# Patient Record
Sex: Female | Born: 2000 | Race: White | Hispanic: No | Smoking: Never smoker
Health system: Southern US, Community
[De-identification: ages and names within clinical notes are randomized; demographics above are authoritative.]

## PROBLEM LIST (undated history)

## (undated) ENCOUNTER — Inpatient Hospital Stay: Payer: Self-pay

## (undated) ENCOUNTER — Ambulatory Visit: Admission: EM | Payer: Medicaid Other

## (undated) DIAGNOSIS — F322 Major depressive disorder, single episode, severe without psychotic features: Secondary | ICD-10-CM

## (undated) DIAGNOSIS — F909 Attention-deficit hyperactivity disorder, unspecified type: Secondary | ICD-10-CM

## (undated) DIAGNOSIS — F332 Major depressive disorder, recurrent severe without psychotic features: Secondary | ICD-10-CM

## (undated) DIAGNOSIS — R519 Headache, unspecified: Secondary | ICD-10-CM

## (undated) DIAGNOSIS — F938 Other childhood emotional disorders: Secondary | ICD-10-CM

## (undated) DIAGNOSIS — H5089 Other specified strabismus: Secondary | ICD-10-CM

## (undated) DIAGNOSIS — F319 Bipolar disorder, unspecified: Secondary | ICD-10-CM

## (undated) DIAGNOSIS — R51 Headache: Secondary | ICD-10-CM

## (undated) HISTORY — PX: EYE SURGERY: SHX253

## (undated) HISTORY — DX: Major depressive disorder, single episode, severe without psychotic features: F32.2

## (undated) HISTORY — PX: TYMPANOSTOMY TUBE PLACEMENT: SHX32

## (undated) HISTORY — DX: Major depressive disorder, recurrent severe without psychotic features: F33.2

## (undated) HISTORY — PX: WISDOM TOOTH EXTRACTION: SHX21

---
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
## 2019-02-25 ENCOUNTER — Emergency Department
Admission: EM | Admit: 2019-02-25 | Discharge: 2019-02-26 | Disposition: A | Discharge status: Other Acute Inpatient Hospital | Payer: Medicaid Other | Attending: Student in an Organized Health Care Education/Training Program | Admitting: Student in an Organized Health Care Education/Training Program

## 2019-02-25 ENCOUNTER — Other Ambulatory Visit: Payer: Self-pay

## 2019-02-25 ENCOUNTER — Encounter: Payer: Self-pay | Admitting: *Deleted

## 2019-02-25 DIAGNOSIS — X789XXA Intentional self-harm by unspecified sharp object, initial encounter: Secondary | ICD-10-CM | POA: Diagnosis not present

## 2019-02-25 DIAGNOSIS — Y999 Unspecified external cause status: Secondary | ICD-10-CM | POA: Diagnosis not present

## 2019-02-25 DIAGNOSIS — Y929 Unspecified place or not applicable: Secondary | ICD-10-CM | POA: Diagnosis not present

## 2019-02-25 DIAGNOSIS — R45851 Suicidal ideations: Secondary | ICD-10-CM | POA: Insufficient documentation

## 2019-02-25 DIAGNOSIS — Z79899 Other long term (current) drug therapy: Secondary | ICD-10-CM | POA: Insufficient documentation

## 2019-02-25 DIAGNOSIS — Y939 Activity, unspecified: Secondary | ICD-10-CM | POA: Diagnosis not present

## 2019-02-25 DIAGNOSIS — Z7289 Other problems related to lifestyle: Secondary | ICD-10-CM | POA: Insufficient documentation

## 2019-02-25 DIAGNOSIS — S41111A Laceration without foreign body of right upper arm, initial encounter: Secondary | ICD-10-CM | POA: Insufficient documentation

## 2019-02-25 DIAGNOSIS — F329 Major depressive disorder, single episode, unspecified: Secondary | ICD-10-CM | POA: Diagnosis not present

## 2019-02-25 DIAGNOSIS — F32A Depression, unspecified: Secondary | ICD-10-CM

## 2019-02-25 LAB — COMPREHENSIVE METABOLIC PANEL
ALT: 13 U/L (ref 0–44)
ANION GAP: 9 (ref 5–15)
AST: 16 U/L (ref 15–41)
Albumin: 4.5 g/dL (ref 3.5–5.0)
Alkaline Phosphatase: 37 U/L — ABNORMAL LOW (ref 38–126)
BUN: 9 mg/dL (ref 6–20)
CO2: 24 mmol/L (ref 22–32)
Calcium: 9.2 mg/dL (ref 8.9–10.3)
Chloride: 107 mmol/L (ref 98–111)
Creatinine, Ser: 0.54 mg/dL (ref 0.44–1.00)
GFR calc Af Amer: >60 mL/min (ref >60)
Glucose, Bld: 92 mg/dL (ref 70–99)
Potassium: 3.5 mmol/L (ref 3.5–5.1)
Sodium: 140 mmol/L (ref 135–145)
Total Bilirubin: 0.6 mg/dL (ref 0.3–1.2)
Total Protein: 7.3 g/dL (ref 6.5–8.1)

## 2019-02-25 LAB — ETHANOL

## 2019-02-25 LAB — POCT PREGNANCY, URINE: Preg Test, Ur: NEGATIVE

## 2019-02-25 LAB — URINE DRUG SCREEN, QUALITATIVE (ARMC ONLY)
Amphetamines, Ur Screen: NOT DETECTED
Barbiturates, Ur Screen: NOT DETECTED
Benzodiazepine, Ur Scrn: NOT DETECTED
Cannabinoid 50 Ng, Ur ~~LOC~~: POSITIVE — AB
Cocaine Metabolite,Ur ~~LOC~~: NOT DETECTED
MDMA (ECSTASY) UR SCREEN: NOT DETECTED
METHADONE SCREEN, URINE: NOT DETECTED
Opiate, Ur Screen: NOT DETECTED
Phencyclidine (PCP) Ur S: NOT DETECTED
Tricyclic, Ur Screen: NOT DETECTED

## 2019-02-25 LAB — CBC
HCT: 35.1 % — ABNORMAL LOW (ref 36.0–46.0)
Hemoglobin: 11.6 g/dL — ABNORMAL LOW (ref 12.0–15.0)
MCH: 31.3 pg (ref 26.0–34.0)
MCHC: 33 g/dL (ref 30.0–36.0)
MCV: 94.6 fL (ref 80.0–100.0)
Platelets: 198 10*3/uL (ref 150–400)
RBC: 3.71 MIL/uL — ABNORMAL LOW (ref 3.87–5.11)
RDW: 13.1 % (ref 11.5–15.5)
WBC: 4.8 10*3/uL (ref 4.0–10.5)
nRBC: 0 % (ref 0.0–0.2)

## 2019-02-25 LAB — ACETAMINOPHEN LEVEL: Acetaminophen (Tylenol), Serum: <10 ug/mL — ABNORMAL LOW (ref 10–30)

## 2019-02-25 LAB — SALICYLATE LEVEL: Salicylate Lvl: <7 mg/dL (ref 2.8–30.0)

## 2019-02-25 NOTE — ED Provider Notes (Signed)
[facility] Emergency Department Provider Note    First MD Initiated Contact with Patient 02/25/19 1954     (approximate)  I have reviewed the triage vital signs and the nursing notes.   HISTORY  Chief Complaint Behavior Problem    HPI Christine May is a 18 y.o. female the below listed past medical history presents the ER for evaluation of self cutting behavior and suicidal ideation.  Patient's mother is here in the ER for evaluation of intentional overdose.  Patient found her mother overdosed and unresponsive on her bed.  States that she feels hopeless and wants to die.    Past Medical History:  Diagnosis Date  . ADHD (attention deficit hyperactivity disorder)   . 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 Bipolar 1 disorder (HCC)   . Headache    No family history on file. Past Surgical History:  Procedure Laterality Date  . TYMPANOSTOMY TUBE PLACEMENT     Patient Active Problem List   Diagnosis Date Noted  . Severe single current episode of major depressive disorder, without psychotic features (HCC)   . Severe episode of recurrent major depressive disorder, without psychotic features (HCC)   . Nausea with vomiting   . Cephalalgia   . 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 Major depression 11/06/2015      Prior to Admission medications   Medication Sig Start Date End Date Taking? Authorizing Provider  amphetamine-dextroamphetamine (ADDERALL XR) 20 MG 24 hr capsule Take 20 mg by mouth daily.    [provider]  etonogestrel (IMPLANON) 68 MG IMPL implant 1 each by Subdermal route once.    [provider]  Lurasidone HCl (LATUDA) 60 MG TABS Take 60 mg by mouth daily.    [provider]  mupirocin ointment (BACTROBAN) 2 % Apply three times a day for 7 days. 03/16/17   Renford DillsMiller, Lindsey, NP  sertraline (ZOLOFT) 25 MG tablet Take 1 tablet (25 mg total) by mouth daily. 11/12/15   Gayland Curryadepalli, Gayathri D, MD     Allergies Patient has no known allergies.    Social History Social History   Tobacco Use  . Smoking status: Never Smoker  . Smokeless tobacco: Never Used  Substance Use Topics  . Alcohol use: No  . Drug use: Not on file    Review of Systems Patient denies headaches, rhinorrhea, blurry vision, numbness, shortness of breath, chest pain, edema, cough, abdominal pain, nausea, vomiting, diarrhea, dysuria, fevers, rashes or hallucinations unless otherwise stated above in HPI. ____________________________________________   PHYSICAL EXAM:  VITAL SIGNS: Vitals:   02/25/19 1936  BP: 132/87  Pulse: 89  Resp: 20  Temp: 99.1 F (37.3 C)  SpO2: 100%    Constitutional: Alert and oriented.  Eyes: Conjunctivae are normal.  Head: Atraumatic. Nose: No congestion/rhinnorhea. Mouth/Throat: Mucous membranes are moist.   Neck: No stridor. Painless ROM.  Cardiovascular: Normal rate, regular rhythm. Grossly normal heart sounds.  Good peripheral circulation. Respiratory: Normal respiratory effort.  No retractions. Lungs CTAB. Gastrointestinal: Soft and nontender. No distention. No abdominal bruits. No CVA tenderness. Genitourinary:  Musculoskeletal: No lower extremity tenderness nor edema.  No joint effusions. Neurologic:  Normal speech and language. No gross focal neurologic deficits are appreciated. No facial droop Skin:  Skin is warm, dry.  Multiple superficial abrasions to the right upper arm with 1 3 cm superficial laceration going through the dermis Psychiatric: Mood and affect are depressed and withdrawn speech and behavior are normal.  ____________________________________________  LABS (all labs ordered are listed, but only abnormal results are displayed)  Results for orders placed or performed during the hospital encounter of 02/25/19 (from the past 24 hour(s))  Urine Drug Screen, Qualitative     Status: Abnormal   Collection Time: 02/25/19  7:38 PM  Result Value Ref  Range   Tricyclic, Ur Screen NONE DETECTED NONE DETECTED   Amphetamines, Ur Screen NONE DETECTED NONE DETECTED   MDMA (Ecstasy)Ur Screen NONE DETECTED NONE DETECTED   Cocaine Metabolite,Ur [place] NONE DETECTED NONE DETECTED   Opiate, Ur Screen NONE DETECTED NONE DETECTED   Phencyclidine (PCP) Ur S NONE DETECTED NONE DETECTED   Cannabinoid 50 Ng, Ur [place] POSITIVE (A) NONE DETECTED   Barbiturates, Ur Screen NONE DETECTED NONE DETECTED   Benzodiazepine, Ur Scrn NONE DETECTED NONE DETECTED   Methadone Scn, Ur NONE DETECTED NONE DETECTED  Comprehensive metabolic panel     Status: Abnormal   Collection Time: 02/25/19  7:40 PM  Result Value Ref Range   Sodium 140 135 - 145 mmol/L   Potassium 3.5 3.5 - 5.1 mmol/L   Chloride 107 98 - 111 mmol/L   CO2 24 22 - 32 mmol/L   Glucose, Bld 92 70 - 99 mg/dL   BUN 9 6 - 20 mg/dL   Creatinine, Ser 0.05 0.44 - 1.00 mg/dL   Calcium 9.2 8.9 - 11.0 mg/dL   Total Protein 7.3 6.5 - 8.1 g/dL   Albumin 4.5 3.5 - 5.0 g/dL   AST 16 15 - 41 U/L   ALT 13 0 - 44 U/L   Alkaline Phosphatase 37 (L) 38 - 126 U/L   Total Bilirubin 0.6 0.3 - 1.2 mg/dL   GFR calc non Af Amer >60 >60 mL/min   GFR calc Af Amer >60 >60 mL/min   Anion gap 9 5 - 15  Ethanol     Status: None   Collection Time: 02/25/19  7:40 PM  Result Value Ref Range   Alcohol, Ethyl (B) <10 <10 mg/dL  Salicylate level     Status: None   Collection Time: 02/25/19  7:40 PM  Result Value Ref Range   Salicylate Lvl <7.0 2.8 - 30.0 mg/dL  Acetaminophen level     Status: Abnormal   Collection Time: 02/25/19  7:40 PM  Result Value Ref Range   Acetaminophen (Tylenol), Serum <10 (L) 10 - 30 ug/mL  cbc     Status: Abnormal   Collection Time: 02/25/19  7:40 PM  Result Value Ref Range   WBC 4.8 4.0 - 10.5 K/uL   RBC 3.71 (L) 3.87 - 5.11 MIL/uL   Hemoglobin 11.6 (L) 12.0 - 15.0 g/dL   HCT 21.1 (L) 17.3 - 56.7 %   MCV 94.6 80.0 - 100.0 fL   MCH 31.3 26.0 - 34.0 pg   MCHC 33.0 30.0 - 36.0 g/dL   RDW 01.4 10.3  - 01.3 %   Platelets 198 150 - 400 K/uL   nRBC 0.0 0.0 - 0.2 %  Pregnancy, urine POC     Status: None   Collection Time: 02/25/19  8:25 PM  Result Value Ref Range   Preg Test, Ur NEGATIVE NEGATIVE   ________________________________________________   PROCEDURES  Procedure(s) performed:  Marland KitchenMarland KitchenLaceration Repair Date/Time: 02/25/2019 9:15 PM Performed by: Willy Eddy, MD Authorized by: Willy Eddy, MD   Consent:    Consent obtained:  Verbal   Consent given by:  Patient   Risks discussed:  Infection, pain, retained foreign body, poor cosmetic result  and poor wound healing Anesthesia (see MAR for exact dosages):    Anesthesia method:  None Laceration details:    Location:  Shoulder/arm   Shoulder/arm location:  R upper arm   Length (cm):  3   Depth (mm):  2 Repair type:    Repair type:  Simple Exploration:    Hemostasis achieved with:  Direct pressure   Wound exploration: entire depth of wound probed and visualized     Contaminated: no   Treatment:    Area cleansed with:  Saline and Hibiclens   Amount of cleaning:  Standard   Visualized foreign bodies/material removed: no   Skin repair:    Repair method:  Tissue adhesive Approximation:    Approximation:  Close Post-procedure details:    Dressing:  Sterile dressing   Patient tolerance of procedure:  Tolerated well, no immediate complications      Critical Care performed: no ____________________________________________   INITIAL IMPRESSION / ASSESSMENT AND PLAN / ED COURSE  Pertinent labs & imaging results that were available during my care of the patient were reviewed by me and considered in my medical decision making (see chart for details).   DDX: Psychosis, delirium, medication effect, noncompliance, polysubstance abuse, Si, Hi, depression   Christine May is a 18 y.o. who presents to the ED with for evaluation of si and laceration of rue.  Patient has psych history as listed above.  Laceration  repaired as listed above.  Reports tetanus is up to date.  Laboratory testing was ordered to evaluation for underlying electrolyte derangement or signs of underlying organic pathology to explain today's presentation.  Based on history and physical and laboratory evaluation, it appears that the patient's presentation is 2/2 underlying psychiatric disorder and will require further evaluation and management by inpatient psychiatry.  Patient was made an IVC due to .  Disposition pending psychiatric evaluation.       As part of my medical decision making, I reviewed the following data within the electronic MEDICAL RECORD NUMBER Nursing notes reviewed and incorporated, Labs reviewed, notes from prior ED visits and [place] Controlled Substance Database   ____________________________________________   FINAL CLINICAL IMPRESSION(S) / ED DIAGNOSES  Final diagnoses:  Depression, unspecified depression type  Deliberate self-cutting      NEW MEDICATIONS STARTED DURING THIS VISIT:  New Prescriptions   No medications on file     Note:  This document was prepared using Dragon voice recognition software and may include unintentional dictation errors.    Willy Eddy, MD 02/25/19 2137

## 2019-02-25 NOTE — BH Assessment (Addendum)
Assessment Note  Christine May is an 18 y.o. female. Christine May arrived to the ED by way of personal transportation by her father.  She reports, "My mom overdosed because of credit card fraud that she committed, because she has not had a job for [name], so that leaves her at home by herself. I came home from my friends house and I got a text from my grandma from my mom, saying to take care of me and my dad, and we rushed home. We found her. My dad told me not to call 911 but I did anyway.  Then I started feeling depressed and I ended up cutting myself and then I went for a walk and then we came to the hospital to see my mom, and then I decided to admit myself.". She states that she is depressed when she is by herself.  She is currently working on her [employer].   She reports symptoms of anxiety. She denied having auditory or visual hallucinations.  She denied homicidal ideation or intent.  She denied suicidal ideation or intent. She had multiple cuts to her upper arm.  She reports stress from moving a lot. Everything went down hill since my mom committed credit card fraud. My father is stressed, my mother is an alcoholic, I smoke weed to calm me down. I had to give away both of my animals, and she kept her dog. I feel that she is being very selfish to [place]. I don't think she will ever be able to get a decent job.  She further reports that she is going through a break up.   TTS contacted father Christine May at [phone], with consent from [name].  Reports that she was upset with what is going on with my wife.  My daughter said she was upset and wanted to go for a walk and I did not know what was going on until we got to the hospital.  She stated "You know what I did".  "So I kinda figured she was cutting. She has a history of cutting, and was hospitalized in the past. "We are dealing with a lot of financial struggles, and my wife's issues, and Christine May and her boyfriend broke up. I know Christine May and my wife had a  problem last night and Christine May slept at a friend's house". He states that he picked her up and that is when she done her thing. "She was upset when we called 911.  I think the last 2 day Christine May and my wife have been fussing and I think it is too much that sent her over the edge.  I think all of this may have overwhelmed her".  Diagnosis: Depression  Past Medical History:  Past Medical History:  Diagnosis Date  . ADHD (attention deficit hyperactivity disorder)   . 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 Bipolar 1 disorder (HCC)   . Headache     Past Surgical History:  Procedure Laterality Date  . TYMPANOSTOMY TUBE PLACEMENT      Family History: No family history on file.  Social History:  reports that she has never smoked. She has never used smokeless tobacco. She reports that she does not drink alcohol. No history on file for drug.  Additional Social History:  Alcohol / Drug Use History of alcohol / drug use?: Yes Substance #1 Name of Substance 1: Marijuana 1 - Age of First Use: 15 1 - Amount (size/oz): 3 blunts a day 1 - Frequency: daily 1 -  Last Use / Amount: 02/25/2019  CIWA: CIWA-Ar BP: 132/87 Pulse Rate: 89 COWS:    Allergies: No Known Allergies  Home Medications: (Not in a hospital admission)   OB/GYN Status:  No LMP recorded. Patient has had an implant.  General Assessment Data Location of Assessment: [facility] ED TTS Assessment: In system Is this a Tele or Face-to-Face Assessment?: Face-to-Face Is this an Initial Assessment or a Re-assessment for this encounter?: Initial Assessment Patient Accompanied by:: N/A Language Other than English: No Living Arrangements: Other (Comment)(Private residence) What gender do you identify as?: Female Marital status: Single Pregnancy Status: No Living Arrangements: Parent Can pt return to current living arrangement?: Yes Admission Status: Voluntary Is patient capable of signing voluntary admission?: Yes Referral Source:  Self/Family/Friend Insurance type: None  Medical Screening Exam [facility] Walk-in ONLY) Medical Exam completed: Yes  Crisis Care Plan Living Arrangements: Parent Legal Guardian: Other:(Self) Name of Psychiatrist: None Name of Therapist: None  Education Status Is patient currently in school?: Yes(Online) Current Grade: GED program Highest grade of school patient has completed: 9th Name of school: [employer]  Risk to self with the past 6 months Suicidal Ideation: No Has patient been a risk to self within the past 6 months prior to admission? : No Suicidal Intent: No Has patient had any suicidal intent within the past 6 months prior to admission? : No Is patient at risk for suicide?: No Suicidal Plan?: No Has patient had any suicidal plan within the past 6 months prior to admission? : No Access to Means: No What has been your use of drugs/alcohol within the last 12 months?: daily use of marijuana Previous Attempts/Gestures: Yes How many times?: 4 Other Self Harm Risks: cutting Triggers for Past Attempts: Unknown Intentional Self Injurious Behavior: Cutting Family Suicide History: (Mother overdosed today, ) Recent stressful life event(s): Other (Comment)(Family problems, break up with boyfriend) Persecutory voices/beliefs?: No Depression: Yes Depression Symptoms: Feeling worthless/self pity Substance abuse history and/or treatment for substance abuse?: Yes Suicide prevention information given to non-admitted patients: Not applicable  Risk to Others within the past 6 months Homicidal Ideation: No Does patient have any lifetime risk of violence toward others beyond the six months prior to admission? : No Thoughts of Harm to Others: No Current Homicidal Intent: No Current Homicidal Plan: No Access to Homicidal Means: No Identified Victim: None identified History of harm to others?: No Assessment of Violence: None Noted Violent Behavior Description: denied Does  patient have access to weapons?: No Criminal Charges Pending?: No Does patient have a court date: No Is patient on probation?: No  Psychosis Hallucinations: None noted Delusions: None noted  Mental Status Report Appearance/Hygiene: In scrubs Eye Contact: Fair Motor Activity: Unremarkable Speech: Logical/coherent, Soft Level of Consciousness: Alert Mood: Helpless Affect: Appropriate to circumstance Anxiety Level: None Thought Processes: Coherent Judgement: Partial Orientation: Appropriate for developmental age Obsessive Compulsive Thoughts/Behaviors: None  Cognitive Functioning Concentration: Fair Memory: Recent Intact Is patient IDD: No Insight: Fair Impulse Control: Fair Appetite: Good Have you had any weight changes? : No Change Sleep: Decreased Vegetative Symptoms: None  ADLScreening [facility] Assessment Services) Patient's cognitive ability adequate to safely complete daily activities?: Yes Patient able to express need for assistance with ADLs?: Yes Independently performs ADLs?: Yes (appropriate for developmental age)  Prior Inpatient Therapy Prior Inpatient Therapy: Yes Prior Therapy Dates: 2016 Prior Therapy Facilty/Provider(s): [facility] Reason for Treatment: Depression  Prior Outpatient Therapy Prior Outpatient Therapy: Yes Prior Therapy Dates: Until 2018 Prior Therapy Facilty/Provider(s): [facility]  [place] Reason for Treatment: Depression Does patient have an ACCT team?: No Does patient have Intensive In-House Services?  : No Does patient have [facility] services? : No Does patient have P4CC services?: No  ADL Screening (condition at time of admission) Patient's cognitive ability adequate to safely complete daily activities?: Yes Is the patient deaf or have difficulty hearing?: No Does the patient have difficulty seeing, even when wearing glasses/contacts?: No Does the patient have difficulty concentrating, remembering, or making  decisions?: No Patient able to express need for assistance with ADLs?: Yes Does the patient have difficulty dressing or bathing?: No Independently performs ADLs?: Yes (appropriate for developmental age) Does the patient have difficulty walking or climbing stairs?: No Weakness of Legs: None Weakness of Arms/Hands: None  Home Assistive Devices/Equipment Home Assistive Devices/Equipment: None    Abuse/Neglect Assessment (Assessment to be complete while patient is alone) Abuse/Neglect Assessment Can Be Completed: (denied a history of abuse)                Disposition:  Disposition Initial Assessment Completed for this Encounter: Yes  On Site Evaluation by:   Reviewed with Physician:    Justice Deeds 02/25/2019 11:55 PM

## 2019-02-25 NOTE — ED Notes (Signed)
1 cell phone and vape given to father.

## 2019-02-25 NOTE — ED Notes (Signed)
Hourly rounding reveals patient in room. EDP at bedside repairing lac. No complaints, stable, in no acute distress. Q15 minute rounds and monitoring via Psychologist, counselling to continue.

## 2019-02-25 NOTE — ED Notes (Signed)
Pt has 2 hair ties, 1 gray colored necklace, multiple stud earrings, 1 navel stud, 1 pair of jeans, 1 panty,  1 black bra, 1 burgundy/ white shirt, 1 pair green socks, 1 pair black vann's.

## 2019-02-25 NOTE — ED Triage Notes (Addendum)
Pt brought in by father.  Pt states she cut herself after her mother tried to kill herself today.  Pt has superficial cuts to right upper arm.  Pt cut self with a knife.  Bleeding controlled.  Pt crying in triage.   Pt uses drugs.  Denies etoh use.

## 2019-02-25 NOTE — ED Notes (Signed)
Pt. Transferred from Triage to room 23 after dressing out and screening for contraband. Pt. Oriented to [place] including Q15 minute rounds as well as Psychologist, counselling for their protection. Patient is alert and oriented, warm and dry in no acute distress. Patient denies HI, and AH. Patient states she still has SI without plan and sees visions of her Mom. Pt. Encouraged to let me know if needs arise.

## 2019-02-25 NOTE — ED Notes (Signed)
Hourly rounding reveals patient in room. No complaints, stable, in no acute distress. Q15 minute rounds and monitoring via Rover and Officer to continue.   

## 2019-02-26 ENCOUNTER — Other Ambulatory Visit: Payer: Self-pay

## 2019-02-26 ENCOUNTER — Encounter (HOSPITAL_COMMUNITY): Payer: Self-pay | Admitting: *Deleted

## 2019-02-26 ENCOUNTER — Inpatient Hospital Stay (HOSPITAL_COMMUNITY)
Admission: AD | Admit: 2019-02-26 | Discharge: 2019-02-28 | DRG: 881 | Disposition: A | Discharge status: Home / Self Care | Payer: Medicaid Other | Source: Intra-hospital | Attending: Psychiatry | Admitting: Psychiatry

## 2019-02-26 DIAGNOSIS — F322 Major depressive disorder, single episode, severe without psychotic features: Secondary | ICD-10-CM

## 2019-02-26 DIAGNOSIS — Z79899 Other long term (current) drug therapy: Secondary | ICD-10-CM

## 2019-02-26 DIAGNOSIS — G479 Sleep disorder, unspecified: Secondary | ICD-10-CM | POA: Diagnosis present

## 2019-02-26 DIAGNOSIS — F329 Major depressive disorder, single episode, unspecified: Secondary | ICD-10-CM | POA: Diagnosis present

## 2019-02-26 DIAGNOSIS — F909 Attention-deficit hyperactivity disorder, unspecified type: Secondary | ICD-10-CM | POA: Diagnosis present

## 2019-02-26 DIAGNOSIS — F419 Anxiety disorder, unspecified: Secondary | ICD-10-CM | POA: Diagnosis present

## 2019-02-26 DIAGNOSIS — F1210 Cannabis abuse, uncomplicated: Secondary | ICD-10-CM | POA: Diagnosis not present

## 2019-02-26 DIAGNOSIS — Z818 Family history of other mental and behavioral disorders: Secondary | ICD-10-CM | POA: Diagnosis not present

## 2019-02-26 DIAGNOSIS — F1290 Cannabis use, unspecified, uncomplicated: Secondary | ICD-10-CM | POA: Diagnosis present

## 2019-02-26 DIAGNOSIS — Z7289 Other problems related to lifestyle: Secondary | ICD-10-CM | POA: Insufficient documentation

## 2019-02-26 DIAGNOSIS — Z811 Family history of alcohol abuse and dependence: Secondary | ICD-10-CM | POA: Diagnosis not present

## 2019-02-26 DIAGNOSIS — Z915 Personal history of self-harm: Secondary | ICD-10-CM | POA: Diagnosis not present

## 2019-02-26 DIAGNOSIS — R45851 Suicidal ideations: Secondary | ICD-10-CM | POA: Diagnosis present

## 2019-02-26 MED ORDER — ESCITALOPRAM OXALATE 5 MG PO TABS
5.0000 mg | ORAL_TABLET | Freq: Every day | ORAL | Status: DC
  Administered 2019-02-26 – 2019-02-28 (×3): 5 mg via ORAL
  Filled 2019-02-26: qty 1
  Filled 2019-02-26: qty 7
  Filled 2019-02-26 (×4): qty 1

## 2019-02-26 MED ORDER — MAGNESIUM HYDROXIDE 400 MG/5ML PO SUSP: 30.0000 mL | Freq: Every day | ORAL | Status: DC | PRN

## 2019-02-26 MED ORDER — HYDROXYZINE HCL 25 MG PO TABS: 25.0000 mg | ORAL_TABLET | Freq: Four times a day (QID) | ORAL | Status: DC | PRN

## 2019-02-26 MED ORDER — ALUM & MAG HYDROXIDE-SIMETH 200-200-20 MG/5ML PO SUSP: 30.0000 mL | ORAL | Status: DC | PRN

## 2019-02-26 MED ORDER — ACETAMINOPHEN 325 MG PO TABS: 650.0000 mg | ORAL_TABLET | Freq: Four times a day (QID) | ORAL | Status: DC | PRN

## 2019-02-26 MED ORDER — ESCITALOPRAM OXALATE 5 MG PO TABS: 5.0000 mg | ORAL_TABLET | Freq: Every day | ORAL | Status: DC

## 2019-02-26 MED ORDER — TRAZODONE HCL 50 MG PO TABS
50.0000 mg | ORAL_TABLET | Freq: Every evening | ORAL | Status: DC | PRN
  Administered 2019-02-26 – 2019-02-27 (×2): 50 mg via ORAL
  Filled 2019-02-26 (×5): qty 1
  Filled 2019-02-26: qty 14
  Filled 2019-02-26: qty 1
  Filled 2019-02-26: qty 14
  Filled 2019-02-26: qty 1

## 2019-02-26 NOTE — ED Notes (Signed)
Hourly rounding reveals patient sleeping in room. No complaints, stable, in no acute distress. Q15 minute rounds and monitoring via Rover and Officer to continue.  

## 2019-02-26 NOTE — ED Notes (Signed)
[place]  SHERIFF  DEPT  CALLED 

## 2019-02-26 NOTE — ED Notes (Signed)
Pt transferred under IVC to [facility].  Patient's father notified by this Clinical research associate of transfer. VS stable.  Report given to Ethelene Browns, [place].  Belongings sent with officers. Pt compliant with transfer.

## 2019-02-26 NOTE — Progress Notes (Signed)
Christine May is an 18 year old female pt admitted on involuntary basis from [facility] hospital. On admission, she spoke about her mother overdosing and how she took a razor and cut her arm in response to that. She denies SI currently and is able to contract for safety while in the hospital. She reports that she had been feeling ok, she reports that she works and goes to school part-time. She does endorse marijuana usage but denies any other substance use. She reports that she is not on any prescribed medications. She reports that she lives at home with her parents and reports that she will return back there once she is discharged. Christine May was cooperative during admission, she was oriented to the milieu and safety maintained.

## 2019-02-26 NOTE — ED Notes (Signed)
Hourly rounding reveals patient in room. No complaints, stable, in no acute distress. Q15 minute rounds and monitoring via Rover and Officer to continue.   

## 2019-02-26 NOTE — ED Notes (Signed)
EMTALA reviewed by charge RN 

## 2019-02-26 NOTE — Tx Team (Signed)
Initial Treatment Plan 02/26/2019 12:36 PM Christine May [id number]    PATIENT STRESSORS: Marital or family conflict   PATIENT STRENGTHS: Ability for insight Average or above average intelligence Capable of independent living General fund of knowledge   PATIENT IDENTIFIED PROBLEMS: Depression Suicidal thoughts "I want to feel happy"                     DISCHARGE CRITERIA:  Ability to meet basic life and health needs Improved stabilization in mood, thinking, and/or behavior Reduction of life-threatening or endangering symptoms to within safe limits Verbal commitment to aftercare and medication compliance  PRELIMINARY DISCHARGE PLAN: Attend aftercare/continuing care group Return to previous living arrangement  PATIENT/FAMILY INVOLVEMENT: This treatment plan has been presented to and reviewed with the patient, Christine May, and/or family member, .  The patient and family have been given the opportunity to ask questions and make suggestions.  [name], [place], [place] 02/26/2019, 12:36 PM

## 2019-02-26 NOTE — H&P (Signed)
Psychiatric Admission Assessment Adult  Patient Identification: Christine May MRN:  [id number] Date of Evaluation:  02/26/2019 Chief Complaint:  MDD Principal Diagnosis: MDD (major depressive disorder), severe (HCC) Diagnosis:  Principal Problem:   MDD (major depressive disorder), severe (HCC)  History of Present Illness: From MD's admission SRA: Patient is an 18 year old female with a reported past psychiatric history significant for bipolar disorder who presented to the [facility] emergency department on 02/25/2019 with suicidal ideation.  The patient stated that she had been under increased stress recently.  Her mother had apparently picked up a credit card in a parking lot in December, and spent excessively on it.  Unfortunately this is led to some legal issues with regard to credit card fraud.  She also admitted that she had broken up with a boyfriend in early January.  The stressors led her to restart cutting again.  She stated she had not cut an extended period of time, but after this most recent issue with her mother she started again.  She stated she had cut her upper right arm with a knife from her bonsai tree.  She denied any other self-harm.  On the day of admission she received a text from her mother about taking care of her father and grandmother.  When she got home they found her mother had overdosed on unspecified medication.  Additionally earlier this week her mother had reportedly been drinking excessively and the patient was upset over this.  When emergency medical services took the patient to the hospital the patient also requested a psychiatric evaluation because she was having suicidal ideation.  As stated above, the patient had a past psychiatric history where she was admitted to the hospital after an intentional overdose of Advil as an adolescent.  This was in 2015.  Again, she was prescribed Latuda and Zoloft.  At that time she felt as though both  medications made her feel "like a zombie".  She stopped them shortly after discharge.  She had continued therapy until approximately 2 years ago and was told that she had completed therapy.  Prior to the issues with her mother she was doing relatively well.  She had left high school because of bullying, and was currently working at [employer] and working on her GED.  She had had no suicide attempts since 2015.  She actually sounded as though she had coped quite well through some of these things.  She denied current suicidal ideation but did admit to being depressed, feeling helpless, somewhat hopeless and tearful about all the episodes involving her mother.  She is not thrilled the death with taking medication, but is willing to try something if it helps her mood.  She did admit that over the last 3 weeks she had had increased sadness over much of this.  She was admitted to the hospital for evaluation and stabilization.  Associated Signs/Symptoms: Depression Symptoms:  depressed mood, anxiety, disturbed sleep, cutting self (Hypo) Manic Symptoms:  denies Anxiety Symptoms:  Excessive Worry, Psychotic Symptoms:  denies PTSD Symptoms: Negative Total Time spent with patient: 45 minutes  Past Psychiatric History: Prior episodes of depression. Hospitalized at [facility] in 2016 after suicide attempt via overdose on 10 migraine medication tabs and 3 Advil. She was prescribed Latuda and Zoloft but stopped taking years ago because she "felt like a zombie." Denies history of manic symptoms. Denies history of psychosis or violence. Treated for ADHD, depression, and "anger outbursts" during childhood. History of self-injurious  behavior (cutting) during childhood but reports stopping 2 years ago. Previously saw therapist at [facility] until 2018.  Is the patient at risk to self? Yes.    Has the patient been a risk to self in the past 6 months? No.  Has the patient been a risk to self within the  distant past? Yes.    Is the patient a risk to others? No.  Has the patient been a risk to others in the past 6 months? No.  Has the patient been a risk to others within the distant past? No.   Prior Inpatient Therapy:   Prior Outpatient Therapy:    Alcohol Screening: 1. How often do you have a drink containing alcohol?: Never 2. How many drinks containing alcohol do you have on a typical day when you are drinking?: 1 or 2 3. How often do you have six or more drinks on one occasion?: Never AUDIT-C Score: 0 4. How often during the last year have you found that you were not able to stop drinking once you had started?: Never 5. How often during the last year have you failed to do what was normally expected from you becasue of drinking?: Never 6. How often during the last year have you needed a first drink in the morning to get yourself going after a heavy drinking session?: Never 7. How often during the last year have you had a feeling of guilt of remorse after drinking?: Never 8. How often during the last year have you been unable to remember what happened the night before because you had been drinking?: Never 9. Have you or someone else been injured as a result of your drinking?: No 10. Has a relative or friend or a doctor or another health worker been concerned about your drinking or suggested you cut down?: No Alcohol Use Disorder Identification Test Final Score (AUDIT): 0 Alcohol Brief Interventions/Follow-up: AUDIT Score <7 follow-up not indicated Substance Abuse History in the last 12 months:  Yes.  Daily THC use. Consequences of Substance Abuse: denies Previous Psychotropic Medications: Yes  Psychological Evaluations: No  Past Medical History:  Past Medical History:  Diagnosis Date  . ADHD (attention deficit hyperactivity disorder)   . 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 Bipolar 1 disorder (HCC)   . Headache     Past Surgical History:  Procedure Laterality Date  .  TYMPANOSTOMY TUBE PLACEMENT     Family History: History reviewed. No pertinent family history. Family Psychiatric  History: Mother with depression and alcohol use disorder.  Tobacco Screening: Have you used any form of tobacco in the last 30 days? (Cigarettes, Smokeless Tobacco, Cigars, and/or Pipes): No Social History:  Social History   Substance and Sexual Activity  Alcohol Use No     Social History   Substance and Sexual Activity  Drug Use Yes  . Types: Marijuana    Additional Social History:                           Allergies:  No Known Allergies Lab Results:  Results for orders placed or performed during the hospital encounter of 02/25/19 (from the past 48 hour(s))  Urine Drug Screen, Qualitative     Status: Abnormal   Collection Time: 02/25/19  7:38 PM  Result Value Ref Range   Tricyclic, Ur Screen NONE DETECTED NONE DETECTED   Amphetamines, Ur Screen NONE DETECTED NONE DETECTED   MDMA (Ecstasy)Ur Screen NONE DETECTED  NONE DETECTED   Cocaine Metabolite,Ur [place] NONE DETECTED NONE DETECTED   Opiate, Ur Screen NONE DETECTED NONE DETECTED   Phencyclidine (PCP) Ur S NONE DETECTED NONE DETECTED   Cannabinoid 50 Ng, Ur [place] POSITIVE (A) NONE DETECTED   Barbiturates, Ur Screen NONE DETECTED NONE DETECTED   Benzodiazepine, Ur Scrn NONE DETECTED NONE DETECTED   Methadone Scn, Ur NONE DETECTED NONE DETECTED    Comment: (NOTE) Tricyclics + metabolites, urine    Cutoff 1000 ng/mL Amphetamines + metabolites, urine  Cutoff 1000 ng/mL MDMA (Ecstasy), urine              Cutoff 500 ng/mL Cocaine Metabolite, urine          Cutoff 300 ng/mL Opiate + metabolites, urine        Cutoff 300 ng/mL Phencyclidine (PCP), urine         Cutoff 25 ng/mL Cannabinoid, urine                 Cutoff 50 ng/mL Barbiturates + metabolites, urine  Cutoff 200 ng/mL Benzodiazepine, urine              Cutoff 200 ng/mL Methadone, urine                   Cutoff 300 ng/mL The urine drug screen provides  only a preliminary, unconfirmed analytical test result and should not be used for non-medical purposes. Clinical consideration and professional judgment should be applied to any positive drug screen result due to possible interfering substances. A more specific alternate chemical method must be used in order to obtain a confirmed analytical result. Gas chromatography / mass spectrometry (GC/MS) is the preferred confirmat ory method. Performed at [facility], [address] [place], [place] [address]   Comprehensive metabolic panel     Status: Abnormal   Collection Time: 02/25/19  7:40 PM  Result Value Ref Range   Sodium 140 135 - 145 mmol/L   Potassium 3.5 3.5 - 5.1 mmol/L   Chloride 107 98 - 111 mmol/L   CO2 24 22 - 32 mmol/L   Glucose, Bld 92 70 - 99 mg/dL   BUN 9 6 - 20 mg/dL   Creatinine, Ser 6.04 0.44 - 1.00 mg/dL   Calcium 9.2 8.9 - 54.0 mg/dL   Total Protein 7.3 6.5 - 8.1 g/dL   Albumin 4.5 3.5 - 5.0 g/dL   AST 16 15 - 41 U/L   ALT 13 0 - 44 U/L   Alkaline Phosphatase 37 (L) 38 - 126 U/L   Total Bilirubin 0.6 0.3 - 1.2 mg/dL   GFR calc non Af Amer >60 >60 mL/min   GFR calc Af Amer >60 >60 mL/min   Anion gap 9 5 - 15    Comment: Performed at [facility], [address] [place], [place] [address]  Ethanol     Status: None   Collection Time: 02/25/19  7:40 PM  Result Value Ref Range   Alcohol, Ethyl (B) <10 <10 mg/dL    Comment: (NOTE) Lowest detectable limit for serum alcohol is 10 mg/dL. For medical purposes only. Performed at [facility], [address] [place], [place] [address]   Salicylate level     Status: None   Collection Time: 02/25/19  7:40 PM  Result Value Ref Range   Salicylate Lvl <7.0 2.8 - 30.0 mg/dL    Comment: Performed at [facility], [address] [place], [place] [address]  Acetaminophen level  Status: Abnormal   Collection Time: 02/25/19  7:40 PM  Result Value Ref Range   Acetaminophen  (Tylenol), Serum <10 (L) 10 - 30 ug/mL    Comment: (NOTE) Therapeutic concentrations vary significantly. A range of 10-30 ug/mL  may be an effective concentration for many patients. However, some  are best treated at concentrations outside of this range. Acetaminophen concentrations >150 ug/mL at 4 hours after ingestion  and >50 ug/mL at 12 hours after ingestion are often associated with  toxic reactions. Performed at [facility], [address] [place], [place] [address]   cbc     Status: Abnormal   Collection Time: 02/25/19  7:40 PM  Result Value Ref Range   WBC 4.8 4.0 - 10.5 K/uL   RBC 3.71 (L) 3.87 - 5.11 MIL/uL   Hemoglobin 11.6 (L) 12.0 - 15.0 g/dL   HCT 60.435.1 (L) 54.036.0 - 98.146.0 %   MCV 94.6 80.0 - 100.0 fL   MCH 31.3 26.0 - 34.0 pg   MCHC 33.0 30.0 - 36.0 g/dL   RDW 19.113.1 47.811.5 - 29.515.5 %   Platelets 198 150 - 400 K/uL   nRBC 0.0 0.0 - 0.2 %    Comment: Performed at [facility], [address] [place], [place] [address]  Pregnancy, urine POC     Status: None   Collection Time: 02/25/19  8:25 PM  Result Value Ref Range   Preg Test, Ur NEGATIVE NEGATIVE    Comment:        THE SENSITIVITY OF THIS METHODOLOGY IS >24 mIU/mL     Blood Alcohol level:  Lab Results  Component Value Date   ETH <10 02/25/2019   ETH <5 11/05/2015    Metabolic Disorder Labs:  Lab Results  Component Value Date   HGBA1C 5.3 10/06/2014   No results found for: PROLACTIN Lab Results  Component Value Date   CHOL 182 10/06/2014   TRIG 55 10/06/2014   HDL 109 (H) 10/06/2014   VLDL 11 10/06/2014   LDLCALC 62 10/06/2014    Current Medications: Current Facility-Administered Medications  Medication Dose Route Frequency Provider Last Rate Last Dose  . acetaminophen (TYLENOL) tablet 650 mg  650 mg Oral Q6H PRN Antonieta Pertlary, Greg Lawson, MD      . alum & mag hydroxide-simeth (MAALOX/MYLANTA) 200-200-20 MG/5ML suspension 30 mL  30 mL Oral Q4H PRN Antonieta Pertlary, Greg Lawson, MD      .  escitalopram (LEXAPRO) tablet 5 mg  5 mg Oral Daily Aldean BakerSykes, Leela Vanbrocklin E, NP      . hydrOXYzine (ATARAX/VISTARIL) tablet 25 mg  25 mg Oral Q6H PRN Antonieta Pertlary, Greg Lawson, MD      . magnesium hydroxide (MILK OF MAGNESIA) suspension 30 mL  30 mL Oral Daily PRN Antonieta Pertlary, Greg Lawson, MD      . traZODone (DESYREL) tablet 50 mg  50 mg Oral QHS,MR X 1 Clary, Marlane MingleGreg Lawson, MD       PTA Medications: Medications Prior to Admission  Medication Sig Dispense Refill Last Dose  . amphetamine-dextroamphetamine (ADDERALL XR) 20 MG 24 hr capsule Take 20 mg by mouth daily.   11/06/2015 at Unknown time  . etonogestrel (IMPLANON) 68 MG IMPL implant 1 each by Subdermal route once.     . Lurasidone HCl (LATUDA) 60 MG TABS Take 60 mg by mouth daily.   11/06/2015 at Unknown time  . mupirocin ointment (BACTROBAN) 2 % Apply three times a day for 7 days. 22 g 0   . sertraline (ZOLOFT)  25 MG tablet Take 1 tablet (25 mg total) by mouth daily. 30 tablet 0     Musculoskeletal: Strength & Muscle Tone: within normal limits Gait & Station: normal Patient leans: N/A  Psychiatric Specialty Exam: Physical Exam  Nursing note and vitals reviewed. Constitutional: She is oriented to person, place, and time. She appears well-developed and well-nourished.  Cardiovascular: Normal rate.  Respiratory: Effort normal.  Neurological: She is alert and oriented to person, place, and time.    Review of Systems  Constitutional: Negative.   Psychiatric/Behavioral: Positive for depression and substance abuse (THC). Negative for hallucinations, memory loss and suicidal ideas. The patient is not nervous/anxious and does not have insomnia.     Blood pressure 110/71, pulse 73, temperature 98.6 F (37 C), temperature source Oral, resp. rate 18, height 5\' 7"  (1.702 m), weight 64.4 kg.Body mass index is 22.24 kg/m.  See MD's admission SRA    Treatment Plan Summary: Daily contact with patient to assess and evaluate symptoms and progress in treatment and  Medication management   Inpatient hospitalization.  Start Lexapro 5 mg PO daily for mood Continue Vistaril 25 mg PO Q6HR PRN anxiety Continue trazodone 50 mg PO QHS PRN insomnia  Patient will participate in the therapeutic group milieu.  Discharge disposition in progress.   Observation Level/Precautions:  15 minute checks  Laboratory:  Reviewed  Psychotherapy:  Group therapy  Medications:  See MAR  Consultations:  PRN  Discharge Concerns:  Safety and stabilization  Estimated LOS: 2-4 days  Other:     Physician Treatment Plan for Primary Diagnosis: MDD (major depressive disorder), severe (HCC) Long Term Goal(s): Improvement in symptoms so as ready for discharge  Short Term Goals: Ability to identify changes in lifestyle to reduce recurrence of condition will improve and Ability to verbalize feelings will improve  Physician Treatment Plan for Secondary Diagnosis: Principal Problem:   MDD (major depressive disorder), severe (HCC)  Long Term Goal(s): Improvement in symptoms so as ready for discharge  Short Term Goals: Ability to demonstrate self-control will improve and Ability to identify and develop effective coping behaviors will improve  I certify that inpatient services furnished can reasonably be expected to improve the patient's condition.    Aldean Baker, NP 3/11/20202:12 PM

## 2019-02-26 NOTE — ED Notes (Signed)
Patient has been accepted to [facility]  Patient assigned to room 400 Bed 1 Accepting physician is Donell Sievert  Call report to [phone].  Representative was [employer].   ER Staff is aware of it:  Carlene ER Secretary  Dr. Darnelle Catalan, ER MD  Jillyn Hidden Patient's Nurse     Patient's Family/Support System [facility] [phone] ) have been updated as well.

## 2019-02-26 NOTE — BHH Suicide Risk Assessment (Signed)
[facility] Admission Suicide Risk Assessment   Nursing information obtained from:  Patient Demographic factors:  Adolescent or young adult, Caucasian, Low socioeconomic status Current Mental Status:  NA Loss Factors:  NA Historical Factors:  Prior suicide attempts, Family history of mental illness or substance abuse Risk Reduction Factors:  Living with another person, especially a relative, Positive therapeutic relationship, Positive coping skills or problem solving skills  Total Time spent with patient: 45 minutes Principal Problem: <principal problem not specified> Diagnosis:  Active Problems:   MDD (major depressive disorder), severe (HCC)  Subjective Data: Patient is seen and examined.  Patient is an 18 year old female with a reported past psychiatric history significant for bipolar disorder who presented to the [facility] emergency department on 02/25/2019 with suicidal ideation.  The patient stated that she had been under increased stress recently.  Her mother had apparently picked up a credit card in a parking lot in December, and spent excessively on it.  Unfortunately this is led to some legal issues with regard to credit card fraud.  She also admitted that she had broken up with a boyfriend in early January.  The stressors led her to restart cutting again.  She stated she had not cut an extended period of time, but after this most recent issue with her mother she started again.  She stated she had cut her upper right arm with a knife from her bonsai tree.  She denied any other self-harm.  On the day of admission she received a text from her mother about taking care of her father and grandmother.  When she got home they found her mother had overdosed on unspecified medication.  Additionally earlier this week her mother had reportedly been drinking excessively and the patient was upset over this.  When emergency medical services took the patient to the hospital the patient  also requested a psychiatric evaluation because she was having suicidal ideation.  As stated above, the patient had a past psychiatric history where she was admitted to the hospital after an intentional overdose of Advil as an adolescent.  This was in 2015.  Again, she was prescribed Latuda and Zoloft.  At that time she felt as though both medications made her feel "like a zombie".  She stopped them shortly after discharge.  She had continued therapy until approximately 2 years ago and was told that she had completed therapy.  Prior to the issues with her mother she was doing relatively well.  She had left high school because of bullying, and was currently working at [employer] and working on her GED.  She had had no suicide attempts since 2015.  She actually sounded as though she had coped quite well through some of these things.  She denied current suicidal ideation but did admit to being depressed, feeling helpless, somewhat hopeless and tearful about all the episodes involving her mother.  She is not thrilled the death with taking medication, but is willing to try something if it helps her mood.  She did admit that over the last 3 weeks she had had increased sadness over much of this.  She was admitted to the hospital for evaluation and stabilization.  Continued Clinical Symptoms:  Alcohol Use Disorder Identification Test Final Score (AUDIT): 0 The "Alcohol Use Disorders Identification Test", Guidelines for Use in Primary Care, Second Edition.  World Science writer [facility]). Score between 0-7:  no or low risk or alcohol related problems. Score between 8-15:  moderate risk  of alcohol related problems. Score between 16-19:  high risk of alcohol related problems. Score 20 or above:  warrants further diagnostic evaluation for alcohol dependence and treatment.   CLINICAL FACTORS:   Depression:   Anhedonia Hopelessness Impulsivity Insomnia Alcohol/Substance  Abuse/Dependencies   Musculoskeletal: Strength & Muscle Tone: within normal limits Gait & Station: normal Patient leans: N/A  Psychiatric Specialty Exam: Physical Exam  Nursing note and vitals reviewed. Constitutional: She appears well-developed and well-nourished.  HENT:  Head: Normocephalic and atraumatic.  Respiratory: Effort normal.  Neurological: She is alert.    ROS  Blood pressure 110/71, pulse 73, temperature 98.6 F (37 C), temperature source Oral, resp. rate 18, height 5\' 7"  (1.702 m), weight 64.4 kg.Body mass index is 22.24 kg/m.  General Appearance: Casual  Eye Contact:  Fair  Speech:  Normal Rate  Volume:  Normal  Mood:  Anxious and Depressed  Affect:  Congruent  Thought Process:  Coherent and Descriptions of Associations: Intact  Orientation:  Full (Time, Place, and Person)  Thought Content:  Logical  Suicidal Thoughts:  Yes.  without intent/plan  Homicidal Thoughts:  No  Memory:  Immediate;   Fair Recent;   Fair Remote;   Fair  Judgement:  Intact  Insight:  Fair  Psychomotor Activity:  Increased  Concentration:  Concentration: Fair and Attention Span: Fair  Recall:  [employer] of Knowledge:  Fair  Language:  Fair  Akathisia:  Negative  Handed:  Right  AIMS (if indicated):     Assets:  Communication Skills Desire for Improvement Financial Resources/Insurance Housing Leisure Time Physical Health Resilience Social Support  ADL's:  Intact  Cognition:  WNL  Sleep:         COGNITIVE FEATURES THAT CONTRIBUTE TO RISK:  None    SUICIDE RISK:   Minimal: No identifiable suicidal ideation.  Patients presenting with no risk factors but with morbid ruminations; may be classified as minimal risk based on the severity of the depressive symptoms  PLAN OF CARE: Patient is seen and examined.  Patient is an 18 year old female with probable past psychiatric history significant for major depression.  She will be admitted to the hospital.  Should be  integrated into the milieu.  She will be encouraged to attend groups.  She has agreed to a medication trial, and we will start Lexapro 5 mg p.o. daily for anxiety and depression.  She will also have available hydroxyzine for anxiety, and trazodone for sleep.  We will contact her father for collateral information.  We will also attempt to get additional information from the father with regards to the mother and when she may be returning home.  I have reviewed her laboratories and they are all essentially normal except for the drug screen positive for marijuana.  We have encouraged her to stop that given her psychiatric situation.  Hopefully she will improve in the next several days.  I certify that inpatient services furnished can reasonably be expected to improve the patient's condition.   Antonieta Pert, MD 02/26/2019, 2:02 PM

## 2019-02-26 NOTE — ED Notes (Signed)
Pt awakened by this writer to infom her of transfer to [facility].  Pt tearful and expressed concerns of losing her job.  Remained cooperative.  VS obtained.  Maintained on 15 minute checks and observation by security for safety.

## 2019-02-26 NOTE — Progress Notes (Signed)
Synia attended wrap-up group. Pt denies SI/HI/AVH/Pain at this time. Pt appears animated/anxious in affect and mood. PRNs offered; Pt declined. No new c/o's. Support provided. Will continue with POC.

## 2019-02-26 NOTE — Progress Notes (Signed)
Patient stated that one positive event for the day was that she made friends today quickly. Her goal for tomorrow is to work on her depression.

## 2019-02-26 NOTE — Consult Note (Signed)
[facility] Face-to-Face Psychiatry Consult   Reason for Consult:  Self cutting  behavior Referring Physician:  Dr. Roxan Hockey Patient Identification: Christine May MRN:  [id number] Principal Diagnosis: Major depression Diagnosis:  Principal Problem:   Major depression   Total Time spent with patient: 1 hour Subjective: "I know what I did was stupid and this is not how I am supposed to deal with my problems."  "In the process of everything I lost my dogs talks and it is very hard."  Christine May is a 18 y.o. female patient presented to [facility] ED via law enforcement under involuntary commitment status (IVC).  The patient was seen face-to-face by this provider; chart reviewed and consulted with Dr.  Roxan Hockey on 02/25/2019 due to the care of the patient. It was discussed with Dr. Merilynn Finland that the patient does meet criteria to be admitted to the inpatient unit.   Due to what has transpired in her life recently.  Today the patient found her mother lying on the bed after intentional overdosing on medication.  The patient called 911.  After her mom was taken to the hospital the patient cut herself on her right upper arm. On evaluation she is alert and oriented x4, extremely upset, but cooperative, and mood is depressed.  The patient does not appear to be responding to internal or external stimuli. Neither is the patient presenting with any delusional thinking. The patient denies any suicidal, homicidal, or self-harm ideations.  "I know I should be able to deal with this situation better. The last time I was hospitalized they gave me lots of coping skills to use."  The patient is not presenting with any psychotic or paranoid behaviors. During an encounter with the patient, she was able to answer questions appropriately. The patient does have history of self-injurious behavior, history of major depressive disorder and is positive for cannabinoid.  Per TTS Counselor Ms. Sloane: TTS contacted father Letetia Romanello at [phone], with consent from [facility].  Reports that she was upset with what is going on with my wife.  My daughter said she was upset and wanted to go for a walk and I did not know what was going on until we got to the hospital.  She stated "You know what I did".  "So I kinda figured she was cutting. She has a history of cutting, and was hospitalized in the past. "We are dealing with a lot of financial struggles, and my wife's issues, and Jamariah and her boyfriend broke up. I know Christan and my wife had a problem last night and Belkis slept at a friend's house". He states that he picked her up and that is when she done her thing. "She was upset when we called 911.  I think the last 2 day Lalani and my wife have been fussing and I think it is too much that sent her over the edge.  I think all of this may have overwhelmed her".  HPI:  Per Dr. Roxan Hockey; Merleen Milliner Dacanay is a 18 y.o. female the below listed past medical history presents the ER for evaluation of self cutting behavior and suicidal ideation.  Patient's mother is here in the ER for evaluation of intentional overdose.  Patient found her mother overdosed and unresponsive on her bed.  States that she feels hopeless and wants to die.   Past Psychiatric History: ADHD (attention deficit hyperactivity disorder) Anxiety disorder of adolescence Bipolar 1 disorder (HCC)  Risk to Self: Suicidal Ideation: No Suicidal Intent:  No Is patient at risk for suicide?: No Suicidal Plan?: No Access to Means: No What has been your use of drugs/alcohol within the last 12 months?: daily use of marijuana How many times?: 4 Other Self Harm Risks: cutting Triggers for Past Attempts: Unknown Intentional Self Injurious Behavior: Cutting Risk to Others: Homicidal Ideation: No Thoughts of Harm to Others: No Current Homicidal Intent: No Current Homicidal Plan: No Access to Homicidal Means: No Identified Victim: None identified History of harm to others?:  No Assessment of Violence: None Noted Violent Behavior Description: denied Does patient have access to weapons?: No Criminal Charges Pending?: No Does patient have a court date: No Prior Inpatient Therapy: Prior Inpatient Therapy: Yes Prior Therapy Dates: 2016 Prior Therapy Facilty/Provider(s): [facility] Reason for Treatment: Depression Prior Outpatient Therapy: Prior Outpatient Therapy: Yes Prior Therapy Dates: Until 2018 Prior Therapy Facilty/Provider(s): [facility] Reason for Treatment: Depression Does patient have an ACCT team?: No Does patient have Intensive In-House Services?  : No Does patient have [facility] services? : No Does patient have P4CC services?: No  Past Medical History:  Past Medical History:  Diagnosis Date  . ADHD (attention deficit hyperactivity disorder)   . 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 Bipolar 1 disorder (HCC)   . Headache     Past Surgical History:  Procedure Laterality Date  . TYMPANOSTOMY TUBE PLACEMENT     Family History: No family history on file. Family Psychiatric  History:  Depression Social History:  Cannabinoid Social History   Substance and Sexual Activity  Alcohol Use No     Social History   Substance and Sexual Activity  Drug Use Not on file    Social History   Socioeconomic History  . Marital status: Single    Spouse name: Not on file  . Number of children: Not on file  . Years of education: Not on file  . Highest education level: Not on file  Occupational History  . Not on file  Social Needs  . Financial resource strain: Not on file  . Food insecurity:    Worry: Not on file    Inability: Not on file  . Transportation needs:    Medical: Not on file    Non-medical: Not on file  Tobacco Use  . Smoking status: Never Smoker  . Smokeless tobacco: Never Used  Substance and Sexual Activity  . Alcohol use: No  . Drug use: Not on file  . Sexual activity: Yes    Birth  control/protection: Implant  Lifestyle  . Physical activity:    Days per week: Not on file    Minutes per session: Not on file  . Stress: Not on file  Relationships  . Social connections:    Talks on phone: Not on file    Gets together: Not on file    Attends religious service: Not on file    Active member of club or organization: Not on file    Attends meetings of clubs or organizations: Not on file    Relationship status: Not on file  Other Topics Concern  . Not on file  Social History Narrative  . Not on file   Additional Social History:    Allergies:  No Known Allergies  Labs:  Results for orders placed or performed during the hospital encounter of 02/25/19 (from the past 48 hour(s))  Urine Drug Screen, Qualitative     Status: Abnormal   Collection Time: 02/25/19  7:38 PM  Result Value  Ref Range   Tricyclic, Ur Screen NONE DETECTED NONE DETECTED   Amphetamines, Ur Screen NONE DETECTED NONE DETECTED   MDMA (Ecstasy)Ur Screen NONE DETECTED NONE DETECTED   Cocaine Metabolite,Ur [address] NONE DETECTED NONE DETECTED   Opiate, Ur Screen NONE DETECTED NONE DETECTED   Phencyclidine (PCP) Ur S NONE DETECTED NONE DETECTED   Cannabinoid 50 Ng, Ur [place] POSITIVE (A) NONE DETECTED   Barbiturates, Ur Screen NONE DETECTED NONE DETECTED   Benzodiazepine, Ur Scrn NONE DETECTED NONE DETECTED   Methadone Scn, Ur NONE DETECTED NONE DETECTED    Comment: (NOTE) Tricyclics + metabolites, urine    Cutoff 1000 ng/mL Amphetamines + metabolites, urine  Cutoff 1000 ng/mL MDMA (Ecstasy), urine              Cutoff 500 ng/mL Cocaine Metabolite, urine          Cutoff 300 ng/mL Opiate + metabolites, urine        Cutoff 300 ng/mL Phencyclidine (PCP), urine         Cutoff 25 ng/mL Cannabinoid, urine                 Cutoff 50 ng/mL Barbiturates + metabolites, urine  Cutoff 200 ng/mL Benzodiazepine, urine              Cutoff 200 ng/mL Methadone, urine                   Cutoff 300 ng/mL The urine drug screen  provides only a preliminary, unconfirmed analytical test result and should not be used for non-medical purposes. Clinical consideration and professional judgment should be applied to any positive drug screen result due to possible interfering substances. A more specific alternate chemical method must be used in order to obtain a confirmed analytical result. Gas chromatography / mass spectrometry (GC/MS) is the preferred confirmat ory method. Performed at [facility], [address] [place], [place] [address]   Comprehensive metabolic panel     Status: Abnormal   Collection Time: 02/25/19  7:40 PM  Result Value Ref Range   Sodium 140 135 - 145 mmol/L   Potassium 3.5 3.5 - 5.1 mmol/L   Chloride 107 98 - 111 mmol/L   CO2 24 22 - 32 mmol/L   Glucose, Bld 92 70 - 99 mg/dL   BUN 9 6 - 20 mg/dL   Creatinine, Ser 8.46 0.44 - 1.00 mg/dL   Calcium 9.2 8.9 - 96.2 mg/dL   Total Protein 7.3 6.5 - 8.1 g/dL   Albumin 4.5 3.5 - 5.0 g/dL   AST 16 15 - 41 U/L   ALT 13 0 - 44 U/L   Alkaline Phosphatase 37 (L) 38 - 126 U/L   Total Bilirubin 0.6 0.3 - 1.2 mg/dL   GFR calc non Af Amer >60 >60 mL/min   GFR calc Af Amer >60 >60 mL/min   Anion gap 9 5 - 15    Comment: Performed at [facility], [address] [place], [place] [address]  Ethanol     Status: None   Collection Time: 02/25/19  7:40 PM  Result Value Ref Range   Alcohol, Ethyl (B) <10 <10 mg/dL    Comment: (NOTE) Lowest detectable limit for serum alcohol is 10 mg/dL. For medical purposes only. Performed at [facility], [address], [place], [place] [address]   Salicylate level     Status: None   Collection Time: 02/25/19  7:40 PM  Result Value Ref Range  Salicylate Lvl <7.0 2.8 - 30.0 mg/dL    Comment: Performed at [facility], [address] [place], [place] [address]  Acetaminophen level     Status: Abnormal   Collection Time: 02/25/19  7:40 PM  Result Value Ref Range    Acetaminophen (Tylenol), Serum <10 (L) 10 - 30 ug/mL    Comment: (NOTE) Therapeutic concentrations vary significantly. A range of 10-30 ug/mL  may be an effective concentration for many patients. However, some  are best treated at concentrations outside of this range. Acetaminophen concentrations >150 ug/mL at 4 hours after ingestion  and >50 ug/mL at 12 hours after ingestion are often associated with  toxic reactions. Performed at [facility], [address] [place], [place] [address]   cbc     Status: Abnormal   Collection Time: 02/25/19  7:40 PM  Result Value Ref Range   WBC 4.8 4.0 - 10.5 K/uL   RBC 3.71 (L) 3.87 - 5.11 MIL/uL   Hemoglobin 11.6 (L) 12.0 - 15.0 g/dL   HCT 57.8 (L) 46.9 - 62.9 %   MCV 94.6 80.0 - 100.0 fL   MCH 31.3 26.0 - 34.0 pg   MCHC 33.0 30.0 - 36.0 g/dL   RDW 52.8 41.3 - 24.4 %   Platelets 198 150 - 400 K/uL   nRBC 0.0 0.0 - 0.2 %    Comment: Performed at [facility], [address] [place], [place] [address]  Pregnancy, urine POC     Status: None   Collection Time: 02/25/19  8:25 PM  Result Value Ref Range   Preg Test, Ur NEGATIVE NEGATIVE    Comment:        THE SENSITIVITY OF THIS METHODOLOGY IS >24 mIU/mL     No current facility-administered medications for this encounter.    Current Outpatient Medications  Medication Sig Dispense Refill  . amphetamine-dextroamphetamine (ADDERALL XR) 20 MG 24 hr capsule Take 20 mg by mouth daily.    Marland Kitchen etonogestrel (IMPLANON) 68 MG IMPL implant 1 each by Subdermal route once.    . Lurasidone HCl (LATUDA) 60 MG TABS Take 60 mg by mouth daily.    . mupirocin ointment (BACTROBAN) 2 % Apply three times a day for 7 days. 22 g 0  . sertraline (ZOLOFT) 25 MG tablet Take 1 tablet (25 mg total) by mouth daily. 30 tablet 0    Musculoskeletal: Strength & Muscle Tone: within normal limits Gait & Station: normal Patient leans: N/A  Psychiatric Specialty Exam: Physical Exam  Nursing note and  vitals reviewed. Constitutional: She is oriented to person, place, and time. She appears well-developed and well-nourished.  HENT:  Head: Normocephalic and atraumatic.  Right Ear: External ear normal.  Left Ear: External ear normal.  Eyes: Pupils are equal, round, and reactive to light. Conjunctivae and EOM are normal.  Neck: Normal range of motion. Neck supple.  Cardiovascular: Normal rate and regular rhythm.  Respiratory: Effort normal and breath sounds normal.  Musculoskeletal: Normal range of motion.  Neurological: She is alert and oriented to person, place, and time. She has normal reflexes.  Skin: Skin is warm and dry.  Psychiatric: Judgment and thought content normal.    Review of Systems  Constitutional: Negative.   HENT: Negative.   Eyes: Negative.   Respiratory: Negative.   Cardiovascular: Negative.   Gastrointestinal: Negative.   Genitourinary: Negative.   Musculoskeletal: Negative.   Neurological: Negative.   Endo/Heme/Allergies: Negative.   Psychiatric/Behavioral: Positive for depression and substance abuse.  Negative for hallucinations, memory loss and suicidal ideas. The patient is nervous/anxious and has insomnia.   All other systems reviewed and are negative.   Blood pressure 132/87, pulse 89, temperature 99.1 F (37.3 C), temperature source Oral, resp. rate 20, height  (1.702 m), weight 72.6 kg, SpO2 100 %.Body mass index is 25.06 kg/m.  General Appearance: Fairly Groomed  Eye Contact:  Good  Speech:  Normal Rate  Volume:  Decreased  Mood:  Anxious, Depressed and Hopeless  Affect:  Blunt, Depressed, Flat and Tearful  Thought Process:  Coherent  Orientation:  Full (Time, Place, and Person)  Thought Content:  Logical  Suicidal Thoughts:  No  Homicidal Thoughts:  No  Memory:  Immediate;   Good  Judgement:  Poor  Insight:  Lacking  Psychomotor Activity:  Normal  Concentration:  Concentration: Fair  Recall:  Good  Fund of Knowledge:  Good  Language:   Good  Akathisia:  NA  Handed:  Right  AIMS (if indicated):     Assets:  Desire for Improvement Financial Resources/Insurance Social Support  ADL's:  Intact  Cognition:  WNL  Sleep:   Okay     Treatment Plan Summary: Daily contact with patient to assess and evaluate symptoms and progress in treatment  Disposition: Supportive therapy provided about ongoing stressors. Refer to IOP. Discussed crisis plan, support from social network, calling 911, coming to the Emergency Department, and calling Suicide Hotline. Patient does meet criteria for inpatient admission.  Catalina Gravel, NP 02/26/2019 2:19 AM

## 2019-02-26 NOTE — ED Provider Notes (Signed)
-----------------------------------------   6:54 AM on 02/26/2019 -----------------------------------------   Blood pressure 132/87, pulse 89, temperature 99.1 F (37.3 C), temperature source Oral, resp. rate 20, height 5\' 7"  (1.702 m), weight 72.6 kg, SpO2 100 %.  The patient is calm and cooperative at this time.  There have been no acute events since the last update.  Awaiting disposition plan from Behavioral Medicine team.    Arnaldo Natal, MD 02/26/19 [phone]

## 2019-02-26 NOTE — ED Notes (Signed)
Patient resting quietly in room. No noted distress or abnormal behaviors noted. Will continue 15 minute checks and observation by security camera for safety. 

## 2019-02-27 NOTE — Plan of Care (Signed)
  Problem: Education: Goal: Knowledge of [facility] Education information/materials will improve Outcome: Progressing Goal: Emotional status will improve Outcome: Progressing  Problem: Activity: Goal: Interest or engagement in activities will improve Outcome: Progressing   Problem: Health Behavior/Discharge Planning: Goal: Compliance with treatment plan for underlying cause of condition will improve Outcome: Progressing   Problem: Safety: Goal: Periods of time without injury will increase Outcome: Progressing

## 2019-02-27 NOTE — Progress Notes (Signed)
Christine May attended wrap-up group. Pt denies SI/HI/AVH/Pain at this time. Pt appears animated/anxious in affect and mood. Pt requested to take trazodone at bedtime. Pt states she hopes to go home tomorrow.No new c/o's. Support provided. Will continue with POC

## 2019-02-27 NOTE — [facility] Group Notes (Signed)
[facility] Mental Health Association Group Therapy      02/27/2019 1:32 PM  Type of Therapy: Mental Health Association Presentation  Participation Level: Active  Participation Quality: Attentive  Affect: Appropriate  Cognitive: Oriented  Insight: Developing/Improving  Engagement in Therapy: Engaged  Modes of Intervention: Discussion, Education and Socialization  Summary of Progress/Problems: Mental Health Association (MHA) Speaker came to talk about his personal journey with mental health. The pt processed ways by which to relate to the speaker. MHA speaker provided handouts and educational information pertaining to groups and services offered by the [facility]. Pt was engaged in speaker's presentation and was receptive to resources provided.    Alcario Drought Clinical Social Worker

## 2019-02-27 NOTE — Progress Notes (Signed)
[facility] MD Progress Note  02/27/2019 1:48 PM Christine May  MRN:  [id number] Subjective:  Patient is an 18 year old female with a reported past psychiatric history significant for bipolar disorder who presented to the [facility] emergency department on 02/25/2019 with suicidal ideation.  The patient stated that she had been under increased stress recently.  Her mother had apparently picked up a credit card in a parking lot in December, and spent excessively on it.  Unfortunately this is led to some legal issues with regard to credit card fraud.  She also admitted that she had broken up with a boyfriend in early January.  The stressors led her to restart cutting again.  Objective: Patient is an 18 year old female with the above-stated past psychiatric history who is seen in follow-up.  She is doing better today.  Her anxiety and mood are more tempered.  She is tolerating the Lexapro without difficulty.  She denied any suicidal or cutting behaviors today.  I spoke with her father today, and the father stated that if it had not been for the patient's mother then the patient would not of decompensated and ended up in the hospital.  Her vital signs are stable, she is afebrile.  She slept 5.5 hours last night.  She stated that when she has difficulty sleeping at home she will often take melatonin.  She is declined any medication for that currently.  Principal Problem: MDD (major depressive disorder), severe (HCC) Diagnosis: Principal Problem:   MDD (major depressive disorder), severe (HCC)  Total Time spent with patient: 15 minutes  Past Psychiatric History: See admission H&P  Past Medical History:  Past Medical History:  Diagnosis Date  . ADHD (attention deficit hyperactivity disorder)   . 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 Bipolar 1 disorder (HCC)   . Headache     Past Surgical History:  Procedure Laterality Date  . TYMPANOSTOMY TUBE PLACEMENT     Family  History: History reviewed. No pertinent family history. Family Psychiatric  History: See admission H&P Social History:  Social History   Substance and Sexual Activity  Alcohol Use No     Social History   Substance and Sexual Activity  Drug Use Yes  . Types: Marijuana    Social History   Socioeconomic History  . Marital status: Single    Spouse name: Not on file  . Number of children: Not on file  . Years of education: Not on file  . Highest education level: Not on file  Occupational History  . Not on file  Social Needs  . Financial resource strain: Not on file  . Food insecurity:    Worry: Not on file    Inability: Not on file  . Transportation needs:    Medical: Not on file    Non-medical: Not on file  Tobacco Use  . Smoking status: Never Smoker  . Smokeless tobacco: Never Used  Substance and Sexual Activity  . Alcohol use: No  . Drug use: Yes    Types: Marijuana  . Sexual activity: Yes    Birth control/protection: Implant  Lifestyle  . Physical activity:    Days per week: Not on file    Minutes per session: Not on file  . Stress: Not on file  Relationships  . Social connections:    Talks on phone: Not on file    Gets together: Not on file    Attends religious service: Not on file    Active member  of club or organization: Not on file    Attends meetings of clubs or organizations: Not on file    Relationship status: Not on file  Other Topics Concern  . Not on file  Social History Narrative  . Not on file   Additional Social History:                         Sleep: Fair  Appetite:  Good  Current Medications: Current Facility-Administered Medications  Medication Dose Route Frequency Provider Last Rate Last Dose  . acetaminophen (TYLENOL) tablet 650 mg  650 mg Oral Q6H PRN Antonieta Pertlary, Greg Lawson, MD      . alum & mag hydroxide-simeth (MAALOX/MYLANTA) 200-200-20 MG/5ML suspension 30 mL  30 mL Oral Q4H PRN Antonieta Pertlary, Greg Lawson, MD      .  escitalopram (LEXAPRO) tablet 5 mg  5 mg Oral Daily Aldean BakerSykes, Janet E, NP   5 mg at 02/27/19 16100808  . hydrOXYzine (ATARAX/VISTARIL) tablet 25 mg  25 mg Oral Q6H PRN Antonieta Pertlary, Greg Lawson, MD      . magnesium hydroxide (MILK OF MAGNESIA) suspension 30 mL  30 mL Oral Daily PRN Antonieta Pertlary, Greg Lawson, MD      . traZODone (DESYREL) tablet 50 mg  50 mg Oral QHS,MR X 1 Antonieta Pertlary, Greg Lawson, MD   50 mg at 02/26/19 2242    Lab Results:  Results for orders placed or performed during the hospital encounter of 02/25/19 (from the past 48 hour(s))  Urine Drug Screen, Qualitative     Status: Abnormal   Collection Time: 02/25/19  7:38 PM  Result Value Ref Range   Tricyclic, Ur Screen NONE DETECTED NONE DETECTED   Amphetamines, Ur Screen NONE DETECTED NONE DETECTED   MDMA (Ecstasy)Ur Screen NONE DETECTED NONE DETECTED   Cocaine Metabolite,Ur [place] NONE DETECTED NONE DETECTED   Opiate, Ur Screen NONE DETECTED NONE DETECTED   Phencyclidine (PCP) Ur S NONE DETECTED NONE DETECTED   Cannabinoid 50 Ng, Ur [place] POSITIVE (A) NONE DETECTED   Barbiturates, Ur Screen NONE DETECTED NONE DETECTED   Benzodiazepine, Ur Scrn NONE DETECTED NONE DETECTED   Methadone Scn, Ur NONE DETECTED NONE DETECTED    Comment: (NOTE) Tricyclics + metabolites, urine    Cutoff 1000 ng/mL Amphetamines + metabolites, urine  Cutoff 1000 ng/mL MDMA (Ecstasy), urine              Cutoff 500 ng/mL Cocaine Metabolite, urine          Cutoff 300 ng/mL Opiate + metabolites, urine        Cutoff 300 ng/mL Phencyclidine (PCP), urine         Cutoff 25 ng/mL Cannabinoid, urine                 Cutoff 50 ng/mL Barbiturates + metabolites, urine  Cutoff 200 ng/mL Benzodiazepine, urine              Cutoff 200 ng/mL Methadone, urine                   Cutoff 300 ng/mL The urine drug screen provides only a preliminary, unconfirmed analytical test result and should not be used for non-medical purposes. Clinical consideration and professional judgment should be applied to  any positive drug screen result due to possible interfering substances. A more specific alternate chemical method must be used in order to obtain a confirmed analytical result. Gas chromatography / mass spectrometry (GC/MS) is the preferred SUPERVAL[employer] INCconfirmat ory  method. Performed at [facility], [address] [place], [place] [address]   Comprehensive metabolic panel     Status: Abnormal   Collection Time: 02/25/19  7:40 PM  Result Value Ref Range   Sodium 140 135 - 145 mmol/L   Potassium 3.5 3.5 - 5.1 mmol/L   Chloride 107 98 - 111 mmol/L   CO2 24 22 - 32 mmol/L   Glucose, Bld 92 70 - 99 mg/dL   BUN 9 6 - 20 mg/dL   Creatinine, Ser 9.14 0.44 - 1.00 mg/dL   Calcium 9.2 8.9 - 78.2 mg/dL   Total Protein 7.3 6.5 - 8.1 g/dL   Albumin 4.5 3.5 - 5.0 g/dL   AST 16 15 - 41 U/L   ALT 13 0 - 44 U/L   Alkaline Phosphatase 37 (L) 38 - 126 U/L   Total Bilirubin 0.6 0.3 - 1.2 mg/dL   GFR calc non Af Amer >60 >60 mL/min   GFR calc Af Amer >60 >60 mL/min   Anion gap 9 5 - 15    Comment: Performed at [facility], [address] [place], [place] [address]  Ethanol     Status: None   Collection Time: 02/25/19  7:40 PM  Result Value Ref Range   Alcohol, Ethyl (B) <10 <10 mg/dL    Comment: (NOTE) Lowest detectable limit for serum alcohol is 10 mg/dL. For medical purposes only. Performed at [facility], [address] [place], [place] [address]   Salicylate level     Status: None   Collection Time: 02/25/19  7:40 PM  Result Value Ref Range   Salicylate Lvl <7.0 2.8 - 30.0 mg/dL    Comment: Performed at [facility], [address] [place], [place] [address]  Acetaminophen level     Status: Abnormal   Collection Time: 02/25/19  7:40 PM  Result Value Ref Range   Acetaminophen (Tylenol), Serum <10 (L) 10 - 30 ug/mL    Comment: (NOTE) Therapeutic concentrations vary significantly. A range of 10-30 ug/mL  may be an effective concentration for many  patients. However, some  are best treated at concentrations outside of this range. Acetaminophen concentrations >150 ug/mL at 4 hours after ingestion  and >50 ug/mL at 12 hours after ingestion are often associated with  toxic reactions. Performed at [facility], [address] [place], [place] [address]   cbc     Status: Abnormal   Collection Time: 02/25/19  7:40 PM  Result Value Ref Range   WBC 4.8 4.0 - 10.5 K/uL   RBC 3.71 (L) 3.87 - 5.11 MIL/uL   Hemoglobin 11.6 (L) 12.0 - 15.0 g/dL   HCT 84.1 (L) 32.4 - 40.1 %   MCV 94.6 80.0 - 100.0 fL   MCH 31.3 26.0 - 34.0 pg   MCHC 33.0 30.0 - 36.0 g/dL   RDW 02.7 25.3 - 66.4 %   Platelets 198 150 - 400 K/uL   nRBC 0.0 0.0 - 0.2 %    Comment: Performed at [facility], [address] [place], [place] [address]  Pregnancy, urine POC     Status: None   Collection Time: 02/25/19  8:25 PM  Result Value Ref Range   Preg Test, Ur NEGATIVE NEGATIVE    Comment:        THE SENSITIVITY OF THIS METHODOLOGY IS >24 mIU/mL     Blood Alcohol level:  Lab Results  Component Value Date   ETH <10 02/25/2019  ETH <5 11/05/2015    Metabolic Disorder Labs: Lab Results  Component Value Date   HGBA1C 5.3 10/06/2014   No results found for: PROLACTIN Lab Results  Component Value Date   CHOL 182 10/06/2014   TRIG 55 10/06/2014   HDL 109 (H) 10/06/2014   VLDL 11 10/06/2014   LDLCALC 62 10/06/2014    Physical Findings: AIMS: Facial and Oral Movements Muscles of Facial Expression: None, normal Lips and Perioral Area: None, normal Jaw: None, normal Tongue: None, normal,Extremity Movements Upper (arms, wrists, hands, fingers): None, normal Lower (legs, knees, ankles, toes): None, normal, Trunk Movements Neck, shoulders, hips: None, normal, Overall Severity Severity of abnormal movements (highest score from questions above): None, normal Incapacitation due to abnormal movements: None, normal Patient's awareness of  abnormal movements (rate only patient's report): No Awareness, Dental Status Current problems with teeth and/or dentures?: No Does patient usually wear dentures?: No  CIWA:    COWS:     Musculoskeletal: Strength & Muscle Tone: within normal limits Gait & Station: normal Patient leans: N/A  Psychiatric Specialty Exam: Physical Exam  Nursing note and vitals reviewed. Constitutional: She is oriented to person, place, and time. She appears well-developed and well-nourished.  HENT:  Head: Normocephalic and atraumatic.  Respiratory: Effort normal.  Neurological: She is alert and oriented to person, place, and time.    ROS  Blood pressure 108/65, pulse 68, temperature 98.9 F (37.2 C), temperature source Oral, resp. rate 18, height 5\' 7"  (1.702 m), weight 64.4 kg.Body mass index is 22.24 kg/m.  General Appearance: Casual  Eye Contact:  Fair  Speech:  Normal Rate  Volume:  Normal  Mood:  Euthymic  Affect:  Congruent  Thought Process:  Coherent and Descriptions of Associations: Intact  Orientation:  Full (Time, Place, and Person)  Thought Content:  Logical  Suicidal Thoughts:  No  Homicidal Thoughts:  No  Memory:  Immediate;   Fair Recent;   Fair Remote;   Fair  Judgement:  Intact  Insight:  Fair  Psychomotor Activity:  Normal  Concentration:  Concentration: Fair and Attention Span: Fair  Recall:  [employer] of Knowledge:  Fair  Language:  Good  Akathisia:  Negative  Handed:  Right  AIMS (if indicated):     Assets:  Communication Skills Desire for Improvement Housing Intimacy Leisure Time Physical Health Resilience  ADL's:  Intact  Cognition:  WNL  Sleep:  Number of Hours: 5.75     Treatment Plan Summary: Daily contact with patient to assess and evaluate symptoms and progress in treatment, Medication management and Plan : Patient is seen and examined.  Patient is an 18 year old female with the above-stated past psychiatric history is seen in follow-up.  She is  doing well today.  She denied any suicidal ideation.  We will continue the Lexapro 5 mg p.o. daily.  If all continues to go well we will anticipate discharge tomorrow morning. 1.  Continue Lexapro 5 mg p.o. daily for anxiety and depression. 2.  Continue hydroxyzine 25 mg every 6 hours as needed anxiety. 3.  Continue trazodone 50 mg p.o. nightly as needed insomnia. 4.  Disposition planning-in progress.  Antonieta Pert, MD 02/27/2019, 1:48 PM

## 2019-02-27 NOTE — Progress Notes (Signed)
Patient ID: Christine May, female   DOB: February 07, 2001, 18 y.o.   MRN: [id number]  Nursing Progress Note [id number]  On initial approach, patient is seen up interacting with peers in the milieu. Patient presents with sad/sullen affect but does engage with Probation officer during interactions. Patient compliant with scheduled medications. Patient states she met her goal today and "called my mom". Patient states, "she tried to kill herself, but God gave her a second chance. I think she needs to be here more than I do". Patient requests writer cover SF cuts on R arm "so that I don't pick at them". Patient denies pain or other concerns for writer. Patient is seen attending groups and visible in the milieu. Patient currently denies SI/HI/AVH.   Patient is educated about and provided medication per provider's orders. Patient safety maintained with q15 min safety checks and low fall risk precautions. Emotional support given, 1:1 interaction, and active listening provided. Patient encouraged to attend meals, groups, and work on treatment plan and goals. Labs, vital signs and patient behavior monitored throughout shift.   Patient contracts for safety with staff. Patient remains safe on the unit at this time and agrees to come to staff with any issues/concerns. Patient is interacting with peers appropriately on the unit. Will continue to support and monitor.   Patient's self-inventory sheet Rated Energy Level  Normal  Rated Sleep  Fair  Rated Appetite  Good  Rated Anxiety (0-10)  2  Rated Hopelessness (0-10)  0  Rated Depression (0-10)  3  Daily Goal  "deciding how to approach my mom"  Any Additional Comments:  N/A

## 2019-02-27 NOTE — BHH Counselor (Signed)
Adult Comprehensive Assessment  Patient ID: VICCI YUNDT, female   DOB: [birth year], 18 y.o.   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 She states that her mother recently attempted to commit suicide.  Financial / Lack of resources (include bankruptcy): Patient reports she is concerned how she and her family will be able to afford this hospitalization due to having strained finances at this time.  Housing / Lack of housing: Lives with both parents in [place], [place]; Patient denies any current stressors  Physical health (include injuries & life threatening diseases): Patient denies any current stressors  Social relationships: Patient reports she worries abouther best friend, who is current experiencing postpartum depression.  Substance abuse: Patient reports she smokes cannabis on a daily basis. She states that smoking cannbis helps her manage her anxiety and stress  Bereavement / Loss: Patient denies any current stressors   Living/Environment/Situation:  Living Arrangements: Parent Living conditions (as described by patient or guardian): "Okay"  Who else lives in the home?: Mom/Dad How long has patient lived in current situation?: "My whole life"  What is atmosphere in current home: Comfortable("It is a little off" )  Family History:  Marital status: Long term relationship Long term relationship, how long?: 1 year  What types  of issues is patient dealing with in the relationship?: Patient reports she and her boyfriend need to work on their Manufacturing systems engineer. She also states that they need to work "on our attitudes"  Additional relationship information: No  Are you sexually active?: Yes What is your sexual orientation?: Bisexual  Has your sexual activity been affected by drugs, alcohol, medication, or emotional stress?: No  Does patient have children?: No  Childhood History:  By whom was/is the patient raised?: Both parents Description of patient's relationship with caregiver when they were a child: Patient reports having a good relationship with both parents during her childhood.  Patient's description of current relationship with people who raised him/her: Patient reports she and her mother currently have a strained relationship, however she and her father continue to have a close relationship.  How were you disciplined when you got in trouble as a child/adolescent?: Spankings  Does patient have siblings?: No Did patient suffer any verbal/emotional/physical/sexual abuse as a child?: No Did patient suffer from severe childhood neglect?: No Has patient ever been sexually abused/assaulted/raped as an adolescent or adult?: No Was the patient ever a victim of a crime or a disaster?: No Witnessed domestic violence?: No Has patient been effected by domestic violence as an adult?: No  Education:  Highest grade of school patient has completed: 9th grade  Currently a student?: Yes Name of school: [employer]  How long has the patient attended?: 1 year  Learning disability?: No  Employment/Work Situation:   Employment situation: Employed Where is patient currently employed?: [employer]  How long has patient been employed?: 3 months  Patient's job has been impacted by current illness: No What is the longest time patient has a held a job?:  1 year  Where was the patient employed at that time?:  [employer]  Did You Receive Any Psychiatric Treatment/Services While in the Military?: No Are There Guns or Other Weapons in Your Home?: No  Financial Resources:   Financial resources: Income from employment, Support from parents / caregiver Does patient have a Lawyer or guardian?: No  Alcohol/Substance Abuse:   What has been your use of drugs/alcohol within the last 12 months?: Cannabis -- daily If attempted suicide, did drugs/alcohol play a role in this?: No Alcohol/Substance Abuse Treatment Hx: Denies past history Has alcohol/substance abuse ever caused legal problems?: No  Social Support System:   Conservation officer, nature Support System: Fair Development worker, community Support System: "My dad and boyfriend"  Type of faith/religion: "God"  How does patient's faith help to cope with current illness?: Patient did not disclose   Leisure/Recreation:   Leisure and Hobbies: "Smoking weed, baby sitting and playing with pets"   Strengths/Needs:   What is the patient's perception of their strengths?: "I am open minded and I am strong"  Patient states they can use these personal strengths during their treatment to contribute to their recovery: Yes  Patient states these barriers may affect/interfere with their treatment: No  Patient states these barriers may affect their return to the community: No  Other important information patient would like considered in planning for their treatment: No   Discharge Plan:   Currently receiving community mental health services: No Patient states concerns and preferences for aftercare planning are: Patient reports she would like to be referred to an outpatient provider for medication management and therapy services  Patient states they will know when they are safe and ready for discharge when: No  Does patient have access to transportation?: Yes Does patient have financial barriers related to discharge medications?: Yes Patient description of barriers  related to discharge medications: No health insurance  Will patient be returning to same living situation after discharge?: Yes  Summary/Recommendations:   Summary and Recommendations (to be completed by the evaluator): Kirie is a 18 year old female who is diagnosed with Depression. She presented to the hospital seeking treatment for worsening depression, suicidal ideation and self harming behaviors (cutting). During the assessment, Lener was pleasant and cooperative with providing information. Lorynn reports that she came to the hospital because her mother attempted to commit suicide and it "pushed her over the edge". Lada states that her mother's suicide attempt was her main trigger. Cherre reports she would like to be referred to an outpatient provider for medication management and therapy services at discharge. Adyn can benefit from crisis stabilization, medication management, therapeutic milieu and referral services.   Maeola Sarah. 02/27/2019

## 2019-02-27 NOTE — Progress Notes (Signed)
The patient rated his day as a 9 out of 10 since he described it as a "quick day". Her goal for tomorrow is to get discharged and hug her dog when she comes home.

## 2019-02-27 NOTE — B[facility] Group Notes (Signed)
[facility] Group Notes:  Nursing Psychoeducation  Date:  02/27/2019  Time:  4:00 PM  Type of Therapy:  Psychoeducational Skills  Participation Level:  Active  Participation Quality:  Appropriate  Affect:  Appropriate  Cognitive:  Alert and Oriented  Insight:  Appropriate  Engagement in Group:  Developing/Improving  Modes of Intervention:  Discussion, Education and Socialization  Summary of Progress/Problems: Patient discussed upcoming events that are making them anxious and ways to prepare for best/worst case scenario. Patient discussed coping skills with the group.  Petina Muraski A Evalynne Locurto 02/27/2019, 5:00 PM 

## 2019-02-28 MED ORDER — ESCITALOPRAM OXALATE 5 MG PO TABS: 5.0000 mg | ORAL_TABLET | Freq: Every day | ORAL | 0 refills | Status: DC

## 2019-02-28 MED ORDER — TRAZODONE HCL 50 MG PO TABS: 50.0000 mg | ORAL_TABLET | Freq: Every evening | ORAL | 0 refills | Status: DC | PRN

## 2019-02-28 NOTE — [facility] Suicide Risk Assessment (Signed)
[facility] INPATIENT:  Family/Significant Other Suicide Prevention Education  Suicide Prevention Education:  Education Completed; Makayden Redbird, father [phone]) has been identified by the patient as the family member/significant other with whom the patient will be residing, and identified as the person(s) who will aid the patient in the event of a mental health crisis (suicidal ideations/suicide attempt).  With written consent from the patient, the family member/significant other has been provided the following suicide prevention education, prior to the and/or following the discharge of the patient.  The suicide prevention education provided includes the following:  Suicide risk factors  Suicide prevention and interventions  National Suicide Hotline telephone number  [facility] assessment telephone number  [facility] Emergency Assistance 911  [facility] and/or Residential Mobile Crisis Unit telephone number  Request made of family/significant other to:  Remove weapons (e.g., guns, rifles, knives), all items previously/currently identified as safety concern.    Remove drugs/medications (over-the-counter, prescriptions, illicit drugs), all items previously/currently identified as a safety concern.  The family member/significant other verbalizes understanding of the suicide prevention education information provided.  The family member/significant other agrees to remove the items of safety concern listed above.  Patient's father reports he does not have any further questions or concerns regarding the patient's discharge. He father requested the patient discharge with doctor notes for school and work. CSW ensured that the patient will discharge with those notes.   Maeola Sarah 02/28/2019, 9:16 AM

## 2019-02-28 NOTE — [facility] Suicide Risk Assessment (Signed)
[facility] Discharge Suicide Risk Assessment   Principal Problem: MDD (major depressive disorder), severe (HCC) Discharge Diagnoses: Principal Problem:   MDD (major depressive disorder), severe (HCC)   Total Time spent with patient: 15 minutes  Musculoskeletal: Strength & Muscle Tone: within normal limits Gait & Station: normal Patient leans: N/A  Psychiatric Specialty Exam: Review of Systems  All other systems reviewed and are negative.   Blood pressure 120/67, pulse 85, temperature 98.9 F (37.2 C), temperature source Oral, resp. rate 18, height 5\' 7"  (1.702 m), weight 64.4 kg.Body mass index is 22.24 kg/m.  General Appearance: Casual  Eye Contact::  Good  Speech:  Normal Rate409  Volume:  Normal  Mood:  Euthymic  Affect:  Congruent  Thought Process:  Coherent and Descriptions of Associations: Intact  Orientation:  Full (Time, Place, and Person)  Thought Content:  Logical  Suicidal Thoughts:  No  Homicidal Thoughts:  No  Memory:  Immediate;   Fair Recent;   Fair Remote;   Fair  Judgement:  Intact  Insight:  Fair  Psychomotor Activity:  Normal  Concentration:  Good  Recall:  Good  Fund of Knowledge:Good  Language: Good  Akathisia:  Negative  Handed:  Right  AIMS (if indicated):     Assets:  Communication Skills Desire for Improvement Financial Resources/Insurance Housing Intimacy Leisure Time Physical Health Resilience  Sleep:  Number of Hours: 6.25  Cognition: WNL  ADL's:  Intact   Mental Status Per Nursing Assessment::   On Admission:  NA  Demographic Factors:  Adolescent or young adult and Caucasian  Loss Factors: Financial problems/change in socioeconomic status  Historical Factors: Impulsivity  Risk Reduction Factors:   Sense of responsibility to family, Living with another person, especially a relative and Positive social support  Continued Clinical Symptoms:  Depression:   Impulsivity  Cognitive Features That Contribute To Risk:  None     Suicide Risk:  Minimal: No identifiable suicidal ideation.  Patients presenting with no risk factors but with morbid ruminations; may be classified as minimal risk based on the severity of the depressive symptoms  Follow-up Information    [facility] Follow up on 03/03/2019.   Why:  Please attend your assessment with Bri on Monday, 3/16 at 2:30p.  Please bring your photo ID, SSN, current medications, and discharge paperwork from this hospitalization.  Contact information: [address] [place] [address] [phone]           Plan Of Care/Follow-up recommendations:  Activity:  ad lib  Antonieta Pert, MD 02/28/2019, 6:30 AM

## 2019-02-28 NOTE — Tx Team (Signed)
Interdisciplinary Treatment and Diagnostic Plan Update  02/28/2019 Time of Session:  Christine May MRN: [id number]  Principal Diagnosis: MDD (major depressive disorder), severe (HCC)  Secondary Diagnoses: Principal Problem:   MDD (major depressive disorder), severe (HCC)   Current Medications:  Current Facility-Administered Medications  Medication Dose Route Frequency Provider Last Rate Last Dose  . acetaminophen (TYLENOL) tablet 650 mg  650 mg Oral Q6H PRN Antonieta Pert, MD      . alum & mag hydroxide-simeth (MAALOX/MYLANTA) 200-200-20 MG/5ML suspension 30 mL  30 mL Oral Q4H PRN Antonieta Pert, MD      . escitalopram (LEXAPRO) tablet 5 mg  5 mg Oral Daily Aldean Baker, NP   5 mg at 02/28/19 8768  . hydrOXYzine (ATARAX/VISTARIL) tablet 25 mg  25 mg Oral Q6H PRN Antonieta Pert, MD      . magnesium hydroxide (MILK OF MAGNESIA) suspension 30 mL  30 mL Oral Daily PRN Antonieta Pert, MD      . traZODone (DESYREL) tablet 50 mg  50 mg Oral QHS,MR X 1 Antonieta Pert, MD   50 mg at 02/27/19 2205   PTA Medications: Medications Prior to Admission  Medication Sig Dispense Refill Last Dose  . acetaminophen (TYLENOL) 325 MG tablet Take 650 mg by mouth every 6 (six) hours as needed for mild pain or headache.     . ibuprofen (ADVIL,MOTRIN) 400 MG tablet Take 400 mg by mouth every 6 (six) hours as needed for headache or mild pain.     Marland Kitchen amphetamine-dextroamphetamine (ADDERALL XR) 20 MG 24 hr capsule Take 20 mg by mouth daily.   Not Taking at Unknown time  . etonogestrel (IMPLANON) 68 MG IMPL implant 1 each by Subdermal route once.     . Lurasidone HCl (LATUDA) 60 MG TABS Take 60 mg by mouth daily.   Not Taking at Unknown time  . mupirocin ointment (BACTROBAN) 2 % Apply three times a day for 7 days. 22 g 0   . sertraline (ZOLOFT) 25 MG tablet Take 1 tablet (25 mg total) by mouth daily. (Patient not taking: Reported on 02/26/2019) 30 tablet 0 Not Taking at Unknown time     Patient Stressors: Marital or family conflict  Patient Strengths: Ability for insight Average or above average intelligence Capable of independent living General fund of knowledge  Treatment Modalities: Medication Management, Group therapy, Case management,  1 to 1 session with clinician, Psychoeducation, Recreational therapy.   Physician Treatment Plan for Primary Diagnosis: MDD (major depressive disorder), severe (HCC) Long Term Goal(s): Improvement in symptoms so as ready for discharge Improvement in symptoms so as ready for discharge   Short Term Goals: Ability to identify changes in lifestyle to reduce recurrence of condition will improve Ability to verbalize feelings will improve Ability to demonstrate self-control will improve Ability to identify and develop effective coping behaviors will improve  Medication Management: Evaluate patient's response, side effects, and tolerance of medication regimen.  Therapeutic Interventions: 1 to 1 sessions, Unit Group sessions and Medication administration.  Evaluation of Outcomes: Adequate for Discharge  Physician Treatment Plan for Secondary Diagnosis: Principal Problem:   MDD (major depressive disorder), severe (HCC)  Long Term Goal(s): Improvement in symptoms so as ready for discharge Improvement in symptoms so as ready for discharge   Short Term Goals: Ability to identify changes in lifestyle to reduce recurrence of condition will improve Ability to verbalize feelings will improve Ability to demonstrate self-control will improve Ability to identify and develop  effective coping behaviors will improve     Medication Management: Evaluate patient's response, side effects, and tolerance of medication regimen.  Therapeutic Interventions: 1 to 1 sessions, Unit Group sessions and Medication administration.  Evaluation of Outcomes: Adequate for Discharge   RN Treatment Plan for Primary Diagnosis: MDD (major depressive  disorder), severe (HCC) Long Term Goal(s): Knowledge of disease and therapeutic regimen to maintain health will improve  Short Term Goals: Ability to participate in decision making will improve, Ability to verbalize feelings will improve, Ability to disclose and discuss suicidal ideas and Ability to identify and develop effective coping behaviors will improve  Medication Management: RN will administer medications as ordered by provider, will assess and evaluate patient's response and provide education to patient for prescribed medication. RN will report any adverse and/or side effects to prescribing provider.  Therapeutic Interventions: 1 on 1 counseling sessions, Psychoeducation, Medication administration, Evaluate responses to treatment, Monitor vital signs and CBGs as ordered, Perform/monitor CIWA, COWS, AIMS and Fall Risk screenings as ordered, Perform wound care treatments as ordered.  Evaluation of Outcomes: Adequate for Discharge   LCSW Treatment Plan for Primary Diagnosis: MDD (major depressive disorder), severe (HCC) Long Term Goal(s): Safe transition to appropriate next level of care at discharge, Engage patient in therapeutic group addressing interpersonal concerns.  Short Term Goals: Engage patient in aftercare planning with referrals and resources  Therapeutic Interventions: Assess for all discharge needs, 1 to 1 time with Social worker, Explore available resources and support systems, Assess for adequacy in community support network, Educate family and significant other(s) on suicide prevention, Complete Psychosocial Assessment, Interpersonal group therapy.  Evaluation of Outcomes: Adequate for Discharge   Progress in Treatment: Attending groups: Yes. Participating in groups: Yes. Taking medication as prescribed: Yes. Toleration medication: Yes. Family/Significant other contact made: Yes, individual(s) contacted:  the patient's father Patient understands diagnosis:  Yes. Discussing patient identified problems/goals with staff: Yes. Medical problems stabilized or resolved: Yes. Denies suicidal/homicidal ideation: Yes. Issues/concerns per patient self-inventory: No. Other:   New problem(s) identified: None    New Short Term/Long Term Goal(s): medication stabilization, elimination of SI thoughts, development of comprehensive mental wellness plan.    Patient Goals:  I want to feel happy   Discharge Plan or Barriers: Patient plans to return home with her father. Patient reports she plans on follow up with [facility] for medication management and therapy services.   Reason for Continuation of Hospitalization: None   Estimated Length of Stay: Discharging, 02/28/2019  Attendees: Patient: 02/28/2019 8:33 AM  Physician: Dr. Landry Mellow, MD 02/28/2019 8:33 AM  Nursing: Marchelle Folks.Salena Saner, RN 02/28/2019 8:33 AM  RN Care Manager: 02/28/2019 8:33 AM  Social Worker: Baldo Daub, LCSWA 02/28/2019 8:33 AM  Recreational Therapist:  02/28/2019 8:33 AM  Other: Marciano Sequin, NP 02/28/2019 8:33 AM  Other:  02/28/2019 8:33 AM  Other: 02/28/2019 8:33 AM    Scribe for Treatment Team: Maeola Sarah, LCSWA 02/28/2019 8:33 AM

## 2019-02-28 NOTE — Progress Notes (Signed)
  [facility] Adult Case Management Discharge Plan :  Will you be returning to the same living situation after discharge:  Yes,  patient reports she is returning home with her father At discharge, do you have transportation home?: Yes,  patient's father will pick her up at discharge Do you have the ability to pay for your medications: No.  Release of information consent forms completed and in the chart;  Patient's signature needed at discharge.  Patient to Follow up at: Follow-up Information    [facility] Follow up on 03/03/2019.   Why:  Please attend your assessment with Bri on Monday, 3/16 at 2:30p.  Please bring your photo ID, SSN, current medications, and discharge paperwork from this hospitalization.  Contact information: [address] [place] [address] [phone]           Next level of care provider has access to [facility] Link:yes  Safety Planning and Suicide Prevention discussed: Yes,  with the patient's father  Have you used any form of tobacco in the last 30 days? (Cigarettes, Smokeless Tobacco, Cigars, and/or Pipes): No  Has patient been referred to the Quitline?: N/A patient is not a smoker  Patient has been referred for addiction treatment: N/A  Maeola Sarah, LCSWA 02/28/2019, 9:33 AM

## 2019-02-28 NOTE — Discharge Summary (Signed)
Physician Discharge Summary Note  Patient:  Christine May is an 18 y.o., female MRN:  [id number] DOB:  2001-09-02 Patient phone:  [phone] (home)  Patient address:   [address] [place] [address],  Total Time spent with patient: 15 minutes  Date of Admission:  02/26/2019 Date of Discharge: 02/28/19  Reason for Admission:  Suicidal ideation  Principal Problem: MDD (major depressive disorder), severe (HCC) Discharge Diagnoses: Principal Problem:   MDD (major depressive disorder), severe (HCC)   Past Psychiatric History: From admission H&P: Prior episodes of depression. Hospitalized at [facility] in 2016 after suicide attempt via overdose on 10 migraine medication tabs and 3 Advil. She was prescribed Latuda and Zoloft but stopped taking years ago because she "felt like a zombie." Denies history of manic symptoms. Denies history of psychosis or violence. Treated for ADHD, depression, and "anger outbursts" during childhood. History of self-injurious behavior (cutting) during childhood but reports stopping 2 years ago. Previously saw therapist at [facility] until 2018.  Past Medical History:  Past Medical History:  Diagnosis Date  . ADHD (attention deficit hyperactivity disorder)   . 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 Bipolar 1 disorder (HCC)   . Headache     Past Surgical History:  Procedure Laterality Date  . TYMPANOSTOMY TUBE PLACEMENT     Family History: History reviewed. No pertinent family history. Family Psychiatric  History: Mother with depression and alcohol use disorder.  Social History:  Social History   Substance and Sexual Activity  Alcohol Use No     Social History   Substance and Sexual Activity  Drug Use Yes  . Types: Marijuana    Social History   Socioeconomic History  . Marital status: Single    Spouse name: Not on file  . Number of children: Not on file  . Years of education: Not on file  . Highest education level:  Not on file  Occupational History  . Not on file  Social Needs  . Financial resource strain: Not on file  . Food insecurity:    Worry: Not on file    Inability: Not on file  . Transportation needs:    Medical: Not on file    Non-medical: Not on file  Tobacco Use  . Smoking status: Never Smoker  . Smokeless tobacco: Never Used  Substance and Sexual Activity  . Alcohol use: No  . Drug use: Yes    Types: Marijuana  . Sexual activity: Yes    Birth control/protection: Implant  Lifestyle  . Physical activity:    Days per week: Not on file    Minutes per session: Not on file  . Stress: Not on file  Relationships  . Social connections:    Talks on phone: Not on file    Gets together: Not on file    Attends religious service: Not on file    Active member of club or organization: Not on file    Attends meetings of clubs or organizations: Not on file    Relationship status: Not on file  Other Topics Concern  . Not on file  Social History Narrative  . Not on file    Hospital Course:   From MD's admission SRA 02/26/2019: Patient is an 18 year old female with a reported past psychiatric history significant for bipolar disorder who presented to the [facility] emergency department on 02/25/2019 with suicidal ideation. The patient stated that she had been under increased stress recently. Her mother  had apparently picked up a credit card in a parking lot in December, and spent excessively on it. Unfortunately this is led to some legal issues with regard to credit card fraud. She also admitted that she had broken up with a boyfriend in early January. The stressors led her to restart cutting again. She stated she had not cut an extended period of time, but after this most recent issue with her mother she started again. She stated she had cut her upper right arm with a knife from her bonsai tree. She denied any other self-harm. On the day of admission she  received a text from her mother about taking care of her father and grandmother. When she got home they found her mother had overdosed on unspecified medication. Additionally earlier this week her mother had reportedly been drinking excessively and the patient was upset over this. When emergency medical services took the patient to the hospital the patient also requested a psychiatric evaluation because she was having suicidal ideation. As stated above, the patient had a past psychiatric history where she was admitted to the hospital after an intentional overdose of Advil as an adolescent. This was in 2015. Again, she was prescribed Latuda and Zoloft. At that time she felt as though both medications made her feel "like a zombie". She stopped them shortly after discharge. She had continued therapy until approximately 2 years ago and was told that she had completed therapy. Prior to the issues with her mother she was doing relatively well. She had left high school because of bullying, and was currently working at [employer] and working on her GED. She had had no suicide attempts since 2015. She actually sounded as though she had coped quite well through some of these things. She denied current suicidal ideation but did admit to being depressed, feeling helpless, somewhat hopeless and tearful about all the episodes involving her mother. She is not thrilled the death with taking medication, but is willing to try something if it helps her mood. She did admit that over the last 3 weeks she had had increased sadness over much of this. She was admitted to the hospital for evaluation and stabilization.  [name] was admitted for suicidal ideation, in the context of multiple stressors. Her mother is facing legal charges for credit card fraud, and she and her parents recently had to move to a smaller apartment due to resulting financial issues, causing her to lose her pets. Patient's mother drinks  excessively. Patient and her boyfriend also broke up recently. On day of presentation to ED patient had found her mother after mother attempted suicide by overdose. On admission to [facility] patient denied SI. Patient reported daily THC use, denied other drug or alcohol use. She was started on Lexapro and PRN trazodone. She participated in group therapy on the unit. She responded well to treatment with no adverse effects reported. Collateral information was obtained from her father, who denied safety concerns for discharge. She remained on the [facility] unit for 2 days. She stabilized with medication and therapy. Patient was discharged on the medications listed below. She has shown improvement with improved mood, affect, sleep, appetite, and interaction. She denies any SI/HI/AVH and contracts for safety. Patient agrees to follow up at [facility] (see below). Patient is provided with prescriptions and medication samples upon discharge. Her father is picking her up for discharge home.  Physical Findings: AIMS: Facial and Oral Movements Muscles of Facial Expression: None, normal Lips and Perioral Area:  None, normal Jaw: None, normal Tongue: None, normal,Extremity Movements Upper (arms, wrists, hands, fingers): None, normal Lower (legs, knees, ankles, toes): None, normal, Trunk Movements Neck, shoulders, hips: None, normal, Overall Severity Severity of abnormal movements (highest score from questions above): None, normal Incapacitation due to abnormal movements: None, normal Patient's awareness of abnormal movements (rate only patient's report): No Awareness, Dental Status Current problems with teeth and/or dentures?: No Does patient usually wear dentures?: No  CIWA:    COWS:     Musculoskeletal: Strength & Muscle Tone: within normal limits Gait & Station: normal Patient leans: N/A  Psychiatric Specialty Exam: Physical Exam  Nursing note and vitals reviewed. Constitutional: She is oriented to person, place, and  time. She appears well-developed and well-nourished.  Cardiovascular: Normal rate.  Respiratory: Effort normal.  Neurological: She is alert and oriented to person, place, and time.  Psychiatric: Her behavior is normal.    Review of Systems  Constitutional: Negative.   Psychiatric/Behavioral: Positive for depression (improving) and substance abuse (THC). Negative for hallucinations, memory loss and suicidal ideas. The patient is not nervous/anxious and does not have insomnia.     Blood pressure 120/67, pulse 85, temperature 98.9 F (37.2 C), temperature source Oral, resp. rate 18, height 5\' 7"  (1.702 m), weight 64.4 kg.Body mass index is 22.24 kg/m.  See MD's discharge SRA     Have you used any form of tobacco in the last 30 days? (Cigarettes, Smokeless Tobacco, Cigars, and/or Pipes): No  Has this patient used any form of tobacco in the last 30 days? (Cigarettes, Smokeless Tobacco, Cigars, and/or Pipes)  No  Blood Alcohol level:  Lab Results  Component Value Date   ETH <10 02/25/2019   ETH <5 11/05/2015    Metabolic Disorder Labs:  Lab Results  Component Value Date   HGBA1C 5.3 10/06/2014   No results found for: PROLACTIN Lab Results  Component Value Date   CHOL 182 10/06/2014   TRIG 55 10/06/2014   HDL 109 (H) 10/06/2014   VLDL 11 10/06/2014   LDLCALC 62 10/06/2014    See Psychiatric Specialty Exam and Suicide Risk Assessment completed by Attending Physician prior to discharge.  Discharge destination:  Home  Is patient on multiple antipsychotic therapies at discharge:  No   Has Patient had three or more failed trials of antipsychotic monotherapy by history:  No  Recommended Plan for Multiple Antipsychotic Therapies: NA  Discharge Instructions    Discharge instructions   Complete by:  As directed    Patient is instructed to take all prescribed medications as recommended. Report any side effects or adverse reactions to your outpatient psychiatrist. Patient is  instructed to abstain from alcohol and illegal drugs while on prescription medications. In the event of worsening symptoms, patient is instructed to call the crisis hotline, 911, or go to the nearest emergency department for evaluation and treatment.     Allergies as of 02/28/2019   No Known Allergies     Medication List    STOP taking these medications   amphetamine-dextroamphetamine 20 MG 24 hr capsule Commonly known as:  ADDERALL XR   ibuprofen 400 MG tablet Commonly known as:  ADVIL,MOTRIN   Latuda 60 MG Tabs Generic drug:  Lurasidone HCl   mupirocin ointment 2 % Commonly known as:  BACTROBAN   sertraline 25 MG tablet Commonly known as:  ZOLOFT     TAKE these medications     Indication  acetaminophen 325 MG tablet Commonly known as:  TYLENOL Take 650  mg by mouth every 6 (six) hours as needed for mild pain or headache.  Indication:  Pain   escitalopram 5 MG tablet Commonly known as:  LEXAPRO Take 1 tablet (5 mg total) by mouth daily. For mood Start taking on:  March 01, 2019  Indication:  Mood   Implanon 68 MG Impl implant Generic drug:  etonogestrel 1 each by Subdermal route once.  Indication:  Birth Control Treatment   traZODone 50 MG tablet Commonly known as:  DESYREL Take 1 tablet (50 mg total) by mouth at bedtime and may repeat dose one time if needed.  Indication:  Trouble Sleeping      Follow-up Information    [facility] Follow up on 03/03/2019.   Why:  Please attend your assessment with Bri on Monday, 3/16 at 2:30p.  Please bring your photo ID, SSN, current medications, and discharge paperwork from this hospitalization.  Contact information: [address] [place] [address] [phone]           Follow-up recommendations: Activity as tolerated. Diet as recommended by primary care physician. Keep all scheduled follow-up appointments as recommended.   Comments:   Patient is instructed to take all prescribed  medications as recommended. Report any side effects or adverse reactions to your outpatient psychiatrist. Patient is instructed to abstain from alcohol and illegal drugs while on prescription medications. In the event of worsening symptoms, patient is instructed to call the crisis hotline, 911, or go to the nearest emergency department for evaluation and treatment.  Signed: Aldean Baker, NP 02/28/2019, 8:10 AM

## 2019-02-28 NOTE — Progress Notes (Signed)
Recreation Therapy Notes  Date:  3.13.20 Time: 0930 Location: [address] Dayroom  Group Topic: Stress Management  Goal Area(s) Addresses:  Patient will identify positive stress management techniques. Patient will identify benefits of using stress management post d/c.  Intervention:  Stress Management  Activity :  Meditation.  LRT introduced the stress management technique of meditation.  LRT played a meditation that focused on approaching the day with endless possibilities.  Education:  Stress Management, Discharge Planning.   Education Outcome: Acknowledges Education  Clinical Observations/Feedback:  Pt did not attend group.     Caroll Rancher, LRT/CTRS        Caroll Rancher A 02/28/2019 10:55 AM

## 2019-02-28 NOTE — Progress Notes (Signed)
Patient ID: Christine May, female   DOB: 02-25-2001, 18 y.o.   MRN: [id number]  Nursing Progress Note [id number]  On initial approach, patient is up in the milieu interacting appropriately with peers. Patient presents calm, pleasant and cooperative. Patient compliant with scheduled medications and denies side effects. Patient currently denies SI/HI/AVH and reports she is ready to discharge today.   Patient is educated about and provided medication per provider's orders. Patient safety maintained with q15 min safety checks and low fall risk precautions. Emotional support given, 1:1 interaction, and active listening provided. Patient encouraged to attend meals, groups, and work on treatment plan and goals. Labs, vital signs and patient behavior monitored throughout shift.   Patient contracts for safety with staff. Patient remains safe on the unit at this time and agrees to come to staff with any issues/concerns. Patient is interacting with peers appropriately on the unit. Will continue to support and monitor.   Patient's self-inventory sheet Rated Energy Level  Normal  Rated Sleep  Fair  Rated Appetite  Good  Rated Anxiety (0-10)  1  Rated Hopelessness (0-10)  0  Rated Depression (0-10)  2  Daily Goal  "use coping skills more often"  Any Additional Comments:  N/A

## 2019-02-28 NOTE — Progress Notes (Signed)
Patient ID: Christine May, female   DOB: 2001-10-30, 18 y.o.   MRN: [id number]  Discharge Note  Patient denies SI/HI and states readiness for discharge.  Written and verbal discharge instructions reviewed with the patient. Patient accepting to information and verbalized understanding with no concerns. All belongings returned to patient from the unit and secured lockers. Patient has completed their Suicide Safety Plan and has been provided Suicide Prevention resources. Patient provided an opportunity to complete and return Patient Satisfaction Survey.   Patient was safely escorted to the lobby for discharge. Patient discharged from [facility] with medication samples, prescriptions, personal belongings, follow-up appointment in place and discharge paperwork.

## 2019-12-25 ENCOUNTER — Ambulatory Visit
Admission: EM | Admit: 2019-12-25 | Discharge: 2019-12-25 | Disposition: A | Discharge status: Home / Self Care | Payer: Medicaid Other | Attending: Family Medicine | Admitting: Family Medicine

## 2019-12-25 ENCOUNTER — Encounter: Payer: Self-pay | Admitting: Emergency Medicine

## 2019-12-25 ENCOUNTER — Other Ambulatory Visit: Payer: Self-pay

## 2019-12-25 DIAGNOSIS — M25562 Pain in left knee: Secondary | ICD-10-CM | POA: Diagnosis not present

## 2019-12-25 MED ORDER — MELOXICAM 15 MG PO TABS: 15.0000 mg | ORAL_TABLET | Freq: Every day | ORAL | 0 refills | Status: DC | PRN

## 2019-12-25 NOTE — ED Triage Notes (Signed)
Patient c/o left knee pain and swelling that started a few days ago. She is c/o bilateral leg pain for a few days as well. Denies injury.

## 2019-12-25 NOTE — Discharge Instructions (Signed)
Rest, ice, elevation.  Medication as needed.  Take care  Dr. Detroit Frieden  

## 2019-12-25 NOTE — ED Provider Notes (Signed)
[facility]    CSN: [id number] Arrival date & time: 12/25/19  0835      History   Chief Complaint Chief Complaint  Patient presents with  . Knee Pain  . Leg Pain   HPI  19 year old female presents with the above complaints.  Patient reports that she has had left knee pain for the past few days.  Denies any injury.  She does state that it seemed to occur after she lifted some chicken at her workplace.  She is also had some bilateral leg pain.  No other joint pains.  No fall, trauma, injury.  Rates her pain as 8/10 in severity.  No relieving factors.  No medications or interventions tried.  No other complaints  PMH, Surgical Hx, Family Hx, Social History reviewed and updated as below.  Past Medical History:  Diagnosis Date  . ADHD (attention deficit hyperactivity disorder)   . 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 Bipolar 1 disorder (HCC)   . Headache    Patient Active Problem List   Diagnosis Date Noted  . MDD (major depressive disorder), severe (HCC) 02/26/2019  . Deliberate self-cutting   . Severe single current episode of major depressive disorder, without psychotic features (HCC)   . Severe episode of recurrent major depressive disorder, without psychotic features (HCC)   . Nausea with vomiting   . Cephalalgia   . 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 Major depression 11/06/2015   Past Surgical History:  Procedure Laterality Date  . TYMPANOSTOMY TUBE PLACEMENT     PR STABISMUS SURG,ONE HORIZ MUSCLE 06/30/2016 Eye/Left Procedure: STRABISMUS SURG; RECESSION OR RESECTION; 1 HORIZONTAL MUSCL; Surgeon: Lurena Joiner, MD; [facility]; Service: Ophthalmology   PR STABISMUS Bethanne Ginger [facility] 06/30/2016 Eye/Left Procedure: STRABISMUS SURG-PT W/SCARRING EXTRAOCULAR MUSCL OR RESTRICTIVE MYOPATHY; Surgeon: Lurena Joiner, MD; Location: [facility] OR [facility]; Service: Ophthalmology     OB History    No obstetric history on file.    Home Medications    Prior to Admission medications   Medication Sig Start Date End Date Taking? Authorizing Provider  etonogestrel (IMPLANON) 68 MG IMPL implant 1 each by Subdermal route once.   Yes [provider]  meloxicam (MOBIC) 15 MG tablet Take 1 tablet (15 mg total) by mouth daily as needed for pain. 12/25/19   Tommie Sams, DO  escitalopram (LEXAPRO) 5 MG tablet Take 1 tablet (5 mg total) by mouth daily. For mood 03/01/19 12/25/19  Aldean Baker, NP  traZODone (DESYREL) 50 MG tablet Take 1 tablet (50 mg total) by mouth at bedtime and may repeat dose one time if needed. 02/28/19 12/25/19  Aldean Baker, NP    Family History Strabismus Mother    Diabetes Paternal Grandfather    Glaucoma Neg Hx    Macular degeneration Neg Hx    Retinal detachment Neg Hx      Social History Social History   Tobacco Use  . Smoking status: Never Smoker  . Smokeless tobacco: Never Used  Substance Use Topics  . Alcohol use: No  . Drug use: Yes    Types: Marijuana     Allergies   Patient has no known allergies.   Review of Systems Review of Systems  Constitutional: Negative.   Musculoskeletal:       Knee pain, leg pain.   Physical Exam Triage Vital Signs ED Triage Vitals  Enc Vitals Group     BP 12/25/19 0850 128/80  Pulse Rate 12/25/19 0850 68     Resp 12/25/19 0850 18     Temp 12/25/19 0850 98.5 F (36.9 C)     Temp Source 12/25/19 0850 Oral     SpO2 12/25/19 0850 100 %     Weight --      Height 12/25/19 0848 5\' 8"  (1.727 m)     Head Circumference --      Peak Flow --      Pain Score 12/25/19 0848 8     Pain Loc --      Pain Edu? --      Excl. in [place]? --    Updated Vital Signs BP 128/80 (BP Location: Right Arm)   Pulse 68   Temp 98.5 F (36.9 C) (Oral)   Resp 18   Ht 5\' 8"  (1.727 m)   SpO2 100%   BMI 21.59 kg/m   Visual Acuity Right Eye Distance:   Left Eye Distance:   Bilateral Distance:    Right Eye  Near:   Left Eye Near:    Bilateral Near:     Physical Exam Vitals and nursing note reviewed.  Constitutional:      General: She is not in acute distress.    Appearance: Normal appearance. She is not ill-appearing.  HENT:     Head: Normocephalic and atraumatic.  Eyes:     General:        Right eye: No discharge.        Left eye: No discharge.     Conjunctiva/sclera: Conjunctivae normal.  Cardiovascular:     Rate and Rhythm: Normal rate and regular rhythm.     Heart sounds: No murmur.  Pulmonary:     Effort: Pulmonary effort is normal.     Breath sounds: Normal breath sounds. No wheezing, rhonchi or rales.  Musculoskeletal:     Comments: Left knee - No effusion.  No erythema. Diffusely tender to palpation.   Neurological:     Mental Status: She is alert.  Psychiatric:        Behavior: Behavior normal.     Comments: Flat affect.    UC Treatments / Results  Labs (all labs ordered are listed, but only abnormal results are displayed) Labs Reviewed - No data to display  EKG   Radiology No results found.  Procedures Procedures (including critical care time)  Medications Ordered in UC Medications - No data to display  Initial Impression / Assessment and Plan / UC Course  I have reviewed the triage vital signs and the nursing notes.  Pertinent labs & imaging results that were available during my care of the patient were reviewed by me and considered in my medical decision making (see chart for details).    19 year old female presents with acute knee pain.  Tenderness is out of proportion to exam findings.  Meloxicam as directed.  Work note given.  Final Clinical Impressions(s) / UC Diagnoses   Final diagnoses:  Acute pain of left knee     Discharge Instructions     Rest, ice, elevation.  Medication as needed.  Take care  Dr. Lacinda Axon     ED Prescriptions    Medication Sig Dispense Auth. Provider   meloxicam (MOBIC) 15 MG tablet Take 1 tablet (15 mg  total) by mouth daily as needed for pain. 30 tablet Coral Spikes, DO     PDMP not reviewed this encounter.   Thersa Salt [place], [place] 12/25/19 [phone]

## 2020-02-18 ENCOUNTER — Other Ambulatory Visit: Payer: Self-pay

## 2020-02-18 ENCOUNTER — Ambulatory Visit
Admission: EM | Admit: 2020-02-18 | Discharge: 2020-02-18 | Disposition: A | Discharge status: Home / Self Care | Payer: Medicaid Other | Attending: Family Medicine | Admitting: Family Medicine

## 2020-02-18 DIAGNOSIS — F329 Major depressive disorder, single episode, unspecified: Secondary | ICD-10-CM

## 2020-02-18 DIAGNOSIS — L0291 Cutaneous abscess, unspecified: Secondary | ICD-10-CM | POA: Diagnosis not present

## 2020-02-18 DIAGNOSIS — F32A Depression, unspecified: Secondary | ICD-10-CM

## 2020-02-18 HISTORY — DX: Other specified strabismus: H50.89

## 2020-02-18 MED ORDER — CEPHALEXIN 500 MG PO CAPS: 500.0000 mg | ORAL_CAPSULE | Freq: Four times a day (QID) | ORAL | 0 refills | Status: AC

## 2020-02-18 NOTE — ED Triage Notes (Signed)
Abscess on midback worsening x 3 days.  Attempted to drain at home with no relief.  Taking Ibuprofen at home and placed cortisone cream at home.   Pt also reports intermittent suicidal thoughts or thoughts of self harm.  States this is just when she gets down and only lasts about an hour.  She states she would not act on the thoughts and denies a plan.  Has been 9 mos since self harmed but reports increase in mood swings recently and considering restarting her medication.

## 2020-02-18 NOTE — Progress Notes (Signed)
Pt states she sometimes has suicidal thoughts r/t her depression but would not act on them.  Thoughts typically last about one hour then she feels better.  Did cut herself with a knife about 9 months ago after her mother attempted suicide.

## 2020-02-18 NOTE — ED Provider Notes (Signed)
[facility]    CSN: [id number] Arrival date & time: 02/18/20  1055      History   Chief Complaint Chief Complaint  Patient presents with  . Abscess    HPI Christine May is a 19 y.o. female.   Christine May presents with complaints of abscess to her back which has increased in size and pain over the past three days. Her boyfriend tried to "pop" it which she feels made it worse. No active drainage. No fevers. Denies any previous similar. No fevers. Also endorses intermittent thoughts of suicide related to her depression. Denies active thoughts, plan or means. History of hospitalization related to depression and was placed on antidepressants, but she has not been taking these. Hasn't followed recently with her behavior health provided. States her boyfriend helps significantly with her management of symptoms. No recent self harm.     ROS per HPI, negative if not otherwise mentioned.      Past Medical History:  Diagnosis Date  . ADHD (attention deficit hyperactivity disorder)   . 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 Bipolar 1 disorder (HCC)   . Depression 2014  . Duanes syndrome   . Headache     Patient Active Problem List   Diagnosis Date Noted  . MDD (major depressive disorder), severe (HCC) 02/26/2019  . Deliberate self-cutting   . Severe single current episode of major depressive disorder, without psychotic features (HCC)   . Severe episode of recurrent major depressive disorder, without psychotic features (HCC)   . Nausea with vomiting   . Cephalalgia   . 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 Major depression 11/06/2015    Past Surgical History:  Procedure Laterality Date  . EYE SURGERY    . TYMPANOSTOMY TUBE PLACEMENT      OB History   No obstetric history on file.      Home Medications    Prior to Admission medications   Medication Sig Start Date End Date Taking? Authorizing Provider  etonogestrel (IMPLANON) 68 MG  IMPL implant 1 each by Subdermal route once.   Yes [provider]  cephALEXin (KEFLEX) 500 MG capsule Take 1 capsule (500 mg total) by mouth 4 (four) times daily for 7 days. 02/18/20 02/25/20  Georgetta Haber, NP  meloxicam (MOBIC) 15 MG tablet Take 1 tablet (15 mg total) by mouth daily as needed for pain. 12/25/19   Tommie Sams, DO  escitalopram (LEXAPRO) 5 MG tablet Take 1 tablet (5 mg total) by mouth daily. For mood 03/01/19 12/25/19  Aldean Baker, NP  traZODone (DESYREL) 50 MG tablet Take 1 tablet (50 mg total) by mouth at bedtime and may repeat dose one time if needed. 02/28/19 12/25/19  Aldean Baker, NP    Family History History reviewed. No pertinent family history.  Social History Social History   Tobacco Use  . Smoking status: Never Smoker  . Smokeless tobacco: Never Used  Substance Use Topics  . Alcohol use: No  . Drug use: Yes    Frequency: 7.0 times per week    Types: Marijuana     Allergies   Patient has no known allergies.   Review of Systems Review of Systems   Physical Exam Triage Vital Signs ED Triage Vitals  Enc Vitals Group     BP 02/18/20 1104 116/76     Pulse Rate 02/18/20 1104 74     Resp 02/18/20 1104 18     Temp 02/18/20 1104 98.1  F (36.7 C)     Temp Source 02/18/20 1104 Oral     SpO2 02/18/20 1104 98 %     Weight --      Height --      Head Circumference --      Peak Flow --      Pain Score 02/18/20 1106 8     Pain Loc --      Pain Edu? --      Excl. in [place]? --    No data found.  Updated Vital Signs BP 116/76 (BP Location: Left Arm)   Pulse 74   Temp 98.1 F (36.7 C) (Oral)   Resp 18   LMP 01/26/2020 (Within Days)   SpO2 98%   Visual Acuity Right Eye Distance:   Left Eye Distance:   Bilateral Distance:    Right Eye Near:   Left Eye Near:    Bilateral Near:     Physical Exam Constitutional:      General: She is not in acute distress.    Appearance: She is well-developed.  Cardiovascular:     Rate and Rhythm:  Normal rate.  Pulmonary:     Effort: Pulmonary effort is normal.  Skin:    General: Skin is warm and dry.          Comments: Approximately 0.75 cm mildly fluctuant abscess to mid back with approximately 1cm of surrounding redness; tender; no active drainage , no induration   Neurological:     Mental Status: She is alert and oriented to person, place, and time.      UC Treatments / Results  Labs (all labs ordered are listed, but only abnormal results are displayed) Labs Reviewed - No data to display  EKG   Radiology No results found.  Procedures Incision and Drainage  Date/Time: 02/18/2020 12:42 PM Performed by: Zigmund Gottron, NP Authorized by: Coral Spikes, DO   Consent:    Consent obtained:  Verbal   Consent given by:  Patient   Risks discussed:  Bleeding, incomplete drainage, pain and infection   Alternatives discussed:  No treatment and referral Location:    Type:  Abscess   Size:  .75   Location:  Trunk   Trunk location:  Back Pre-procedure details:    Skin preparation:  Betadine Anesthesia (see MAR for exact dosages):    Anesthesia method:  Local infiltration   Local anesthetic:  Lidocaine 1% WITH epi Procedure type:    Complexity:  Simple Procedure details:    Incision types:  Stab incision   Scalpel blade:  11   Drainage:  Purulent   Drainage amount:  Scant   Wound treatment:  Wound left open   Packing materials:  None Post-procedure details:    Patient tolerance of procedure:  Tolerated well, no immediate complications   (including critical care time)  Medications Ordered in UC Medications - No data to display  Initial Impression / Assessment and Plan / UC Course  I have reviewed the triage vital signs and the nursing notes.  Pertinent labs & imaging results that were available during my care of the patient were reviewed by me and considered in my medical decision making (see chart for details).     Superficial abscess without  induration. Stab incision performed with scant output, patient tolerated well. Keflex provided and warm compresses encouraged.   Behavioral health discussed as well. No indication of immediate risk of self harm with resources provided, patient did contract for safety,  agreeable to go to ER for any worsening of thoughts or development of plan. Agrees to follow up with her behavioral health specialist.   Patient verbalized understanding and agreeable to plan.  Ambulatory out of clinic without difficulty.    Final Clinical Impressions(s) / UC Diagnoses   Final diagnoses:  Abscess     Discharge Instructions     Keep dressing in place for the next 24 hours. May remove tomorrow.  Then cleanse area daily with soap and water.  Keep covered to keep protected and collect drainage.   Warm compresses every few hours to promote drainage.  Complete course of antibiotics.  Tylenol and/or ibuprofen as needed for pain or fevers.   Please set up follow up with your behavioral health specialist  If symptoms worsen or do not improve in the next week to return to be seen or to follow up with PCP.     ED Prescriptions    Medication Sig Dispense Auth. Provider   cephALEXin (KEFLEX) 500 MG capsule Take 1 capsule (500 mg total) by mouth 4 (four) times daily for 7 days. 28 capsule Georgetta Haber, NP     PDMP not reviewed this encounter.   Georgetta Haber, NP 02/18/20 1245

## 2020-02-18 NOTE — Discharge Instructions (Signed)
Keep dressing in place for the next 24 hours. May remove tomorrow.  Then cleanse area daily with soap and water.  Keep covered to keep protected and collect drainage.   Warm compresses every few hours to promote drainage.  Complete course of antibiotics.  Tylenol and/or ibuprofen as needed for pain or fevers.   Please set up follow up with your behavioral health specialist  If symptoms worsen or do not improve in the next week to return to be seen or to follow up with PCP.

## 2020-12-19 ENCOUNTER — Other Ambulatory Visit: Payer: Self-pay

## 2020-12-19 ENCOUNTER — Ambulatory Visit
Admission: EM | Admit: 2020-12-19 | Discharge: 2020-12-19 | Disposition: A | Discharge status: Home / Self Care | Payer: Medicaid Other | Attending: Emergency Medicine | Admitting: Emergency Medicine

## 2020-12-19 DIAGNOSIS — G43009 Migraine without aura, not intractable, without status migrainosus: Secondary | ICD-10-CM | POA: Insufficient documentation

## 2020-12-19 DIAGNOSIS — U071 COVID-19: Secondary | ICD-10-CM | POA: Insufficient documentation

## 2020-12-19 DIAGNOSIS — R430 Anosmia: Secondary | ICD-10-CM

## 2020-12-19 NOTE — ED Triage Notes (Signed)
Patient presents to Urgent Care with complaints of headache, loss of smell/taste since 12/28. Patient reports having a rapid covid test resulted negative last night.   Denies fever, n/v.

## 2020-12-19 NOTE — ED Provider Notes (Signed)
[facility] [place]   Name: Christine May DOB: [birth year] MRN: [id number] CSN: [id number] PCP: Patient, No Pcp Per  Arrival date and time:  12/19/20 0845  Chief Complaint:  Headache   NOTE: Prior to seeing the patient today, I have reviewed the triage nursing documentation and vital signs. Clinical staff has updated patient's PMH/PSHx, current medication list, and drug allergies/intolerances to ensure comprehensive history available to assist in medical decision making.   History:   HPI: Christine May is a 20 y.o. female who presents today with complaints of below.  COVID-19 assessment Symptoms: Migraines, chills, loss of smell and taste Symptom onset: 12/28 Vaccination status: Unvaccinated Positive exposure: No known positive closure Social precautions: Wears a mask in public Employment risk: High.  Works at [employer]: lives with boyfriend and parents in a single-family home Previous COVID-19 test result: None  Past Medical History:  Diagnosis Date  . ADHD (attention deficit hyperactivity disorder)   . 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 Bipolar 1 disorder (HCC)   . Depression 2014  . Duanes syndrome   . Headache     Past Surgical History:  Procedure Laterality Date  . EYE SURGERY    . TYMPANOSTOMY TUBE PLACEMENT      History reviewed. No pertinent family history.  Social History   Tobacco Use  . Smoking status: Never Smoker  . Smokeless tobacco: Never Used  Vaping Use  . Vaping Use: Every day  . Substances: Nicotine, Flavoring  Substance Use Topics  . Alcohol use: No  . Drug use: Yes    Frequency: 7.0 times per week    Types: Marijuana    Patient Active Problem List   Diagnosis Date Noted  . MDD (major depressive disorder), severe (HCC) 02/26/2019  . Deliberate self-cutting   . Severe single current episode of major depressive disorder, without psychotic features (HCC)   . Severe episode of recurrent  major depressive disorder, without psychotic features (HCC)   . Nausea with vomiting   . Cephalalgia   . 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 Major depression 11/06/2015    Home Medications:    No outpatient medications have been marked as taking for the 12/19/20 encounter [facility] Encounter).    Allergies:   Patient has no known allergies.  Review of Systems (ROS): Review of Systems  Constitutional: Positive for chills and fatigue.  Neurological: Positive for headaches.  All other systems reviewed and are negative.    Vital Signs: Today's Vitals   12/19/20 1034 12/19/20 1104 12/19/20 1126  BP:  126/74   Pulse:  61   Resp:  16   Temp:  98.5 F (36.9 C)   TempSrc:  Oral   SpO2:  100%   Weight: 155 lb (70.3 kg)    PainSc: 0-No pain  0-No pain    Physical Exam: Physical Exam Vitals and nursing note reviewed.  Constitutional:      Appearance: Normal appearance.  Cardiovascular:     Rate and Rhythm: Normal rate and regular rhythm.     Pulses: Normal pulses.     Heart sounds: Normal heart sounds.  Pulmonary:     Breath sounds: Normal breath sounds.  Skin:    General: Skin is warm and dry.  Neurological:     Mental Status: She is alert.  Psychiatric:        Mood and Affect: Mood normal.        Behavior: Behavior normal.  Thought Content: Thought content normal.      Urgent Care Treatments / Results:   LABS: PLEASE NOTE: all labs that were ordered this encounter are listed, however only abnormal results are displayed. Labs Reviewed  SARS CORONAVIRUS 2 (TAT 6-24 HRS)    EKG: -None  RADIOLOGY: No results found.  PROCEDURES: Procedures  MEDICATIONS RECEIVED THIS VISIT: Medications - No data to display  PERTINENT CLINICAL COURSE NOTES/UPDATES:   Initial Impression / Assessment and Plan / Urgent Care Course:  Pertinent labs & imaging results that were available during my care of the patient were personally reviewed by me and  considered in my medical decision making (see lab/imaging section of note for values and interpretations).  Christine May is a 20 y.o. female who presents to [facility] today with complaints of migraines and loss of smell/taste, diagnosed with the same, and treated as such with the directions below. NP and patient reviewed discharge instructions below during visit.   Patient is well appearing overall in clinic today. She does not appear to be in any acute distress. Presenting symptoms (see HPI) and exam as documented above.   I have reviewed the follow up and strict return precautions for any new or worsening symptoms. Patient is aware of symptoms that would be deemed urgent/emergent, and would thus require further evaluation either here or in the emergency department. At the time of discharge, she verbalized understanding and consent with the discharge plan as it was reviewed with her. All questions were fielded by provider and/or clinic staff prior to patient discharge.    Final Clinical Impressions / Urgent Care Diagnoses:   Final diagnoses:  Loss of smell  Migraine without aura and without status migrainosus, not intractable    New Prescriptions:  [place] Controlled Substance Registry consulted? Not Applicable  No orders of the defined types were placed in this encounter.     Discharge Instructions     You were seen for smell and taste, migraines, and chills and are being treated for the same.   You were tested for COVID-19. If you know you were exposed to a confirmed case of COVID-19, continue quarantine until your test results are available.  Interact as little as possible until COVID-19 test results are available.  Continue to keep her social distance of 6 feet from others, wash hands frequently and wear facemask when indoors or when you are unable to social distance in outdoor settings.  If you develop any symptoms, reach out to the urgent care for further instructions.  If  your test results are positive, a member of the urgent care team will reach out to you with further instructions. If you have any further questions, please don't hesitate to reach out to the urgent care clinic.  Over the counter medications that will help your symptoms: Flonase (runny nose, congestion), Zyrtec or Xyzal (postnasal drip, sneezing), Tylenol/Ibuprofen (fever and body aches).  Please sign up for MyChart to access your lab results.  Take care, Dr. Sharlet Salina, NP-c     Recommended Follow up Care:  Patient encouraged to follow up with the following provider within the specified time frame, or sooner as dictated by the severity of her symptoms. As always, she was instructed that for any urgent/emergent care needs, she should seek care either here or in the emergency department for more immediate evaluation.   Bailey Mech, DNP, NP-c    Bailey Mech, NP 12/19/20 1159

## 2020-12-19 NOTE — Discharge Instructions (Signed)
You were seen for smell and taste, migraines, and chills and are being treated for the same.   You were tested for COVID-19. If you know you were exposed to a confirmed case of COVID-19, continue quarantine until your test results are available.  Interact as little as possible until COVID-19 test results are available.  Continue to keep her social distance of 6 feet from others, wash hands frequently and wear facemask when indoors or when you are unable to social distance in outdoor settings.  If you develop any symptoms, reach out to the urgent care for further instructions.  If your test results are positive, a member of the urgent care team will reach out to you with further instructions. If you have any further questions, please don't hesitate to reach out to the urgent care clinic.  Over the counter medications that will help your symptoms: Flonase (runny nose, congestion), Zyrtec or Xyzal (postnasal drip, sneezing), Tylenol/Ibuprofen (fever and body aches).  Please sign up for MyChart to access your lab results.  Take care, Dr. Sharlet Salina, NP-c

## 2020-12-20 LAB — SARS CORONAVIRUS 2 (TAT 6-24 HRS): SARS Coronavirus 2: POSITIVE — AB

## 2021-04-07 ENCOUNTER — Encounter: Payer: Self-pay | Admitting: Emergency Medicine

## 2021-04-07 ENCOUNTER — Other Ambulatory Visit: Payer: Self-pay

## 2021-04-07 ENCOUNTER — Ambulatory Visit
Admission: EM | Admit: 2021-04-07 | Discharge: 2021-04-07 | Disposition: A | Discharge status: Home / Self Care | Payer: Medicaid Other | Attending: Internal Medicine | Admitting: Internal Medicine

## 2021-04-07 DIAGNOSIS — F3289 Other specified depressive episodes: Secondary | ICD-10-CM | POA: Diagnosis not present

## 2021-04-07 DIAGNOSIS — J0100 Acute maxillary sinusitis, unspecified: Secondary | ICD-10-CM

## 2021-04-07 MED ORDER — AMOXICILLIN-POT CLAVULANATE 875-125 MG PO TABS: 1.0000 | ORAL_TABLET | Freq: Two times a day (BID) | ORAL | 0 refills | Status: DC

## 2021-04-07 NOTE — ED Provider Notes (Signed)
[facility]    CSN: [id number] Arrival date & time: 04/07/21  1444      History   Chief Complaint Chief Complaint  Patient presents with  . Cough  . Nasal Congestion    HPI Christine May is a 20 y.o. female who has been having brown post nasal drainage x 2 1/5 weeks. Deneis fever.  Had Covid in January and recovered fine from it.  The [facility] nurse noted her depression questionnaire as high risk and told me about this. I askee pt about what was going on.  She Has hx of depression since very young and has thoughts of wanting to end her life, but she has never attempted suicide and has no plan. Is happy with be dating her boyfriend for the past 3 years. She was placed on medication in the past but did not help her since she just gets numb and cant express her emotions. She does not really want to go back to therapy because she does not feel understood.     Past Medical History:  Diagnosis Date  . ADHD (attention deficit hyperactivity disorder)   . 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 Bipolar 1 disorder (HCC)   . Depression 2014  . Duanes syndrome   . Headache     Patient Active Problem List   Diagnosis Date Noted  . MDD (major depressive disorder), severe (HCC) 02/26/2019  . Deliberate self-cutting   . Severe single current episode of major depressive disorder, without psychotic features (HCC)   . Severe episode of recurrent major depressive disorder, without psychotic features (HCC)   . Nausea with vomiting   . Cephalalgia   . 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 Major depression 11/06/2015    Past Surgical History:  Procedure Laterality Date  . EYE SURGERY    . TYMPANOSTOMY TUBE PLACEMENT    . WISDOM TOOTH EXTRACTION      OB History   No obstetric history on file.      Home Medications    Prior to Admission medications   Medication Sig Start Date End Date Taking? Authorizing Provider  amoxicillin-clavulanate (AUGMENTIN)  875-125 MG tablet Take 1 tablet by mouth every 12 (twelve) hours. 04/07/21  Yes Rodriguez-Southworth, Nettie Elm, PA-C  etonogestrel (NEXPLANON) 68 MG IMPL implant 1 each by Subdermal route once.   Yes [provider]  escitalopram (LEXAPRO) 5 MG tablet Take 1 tablet (5 mg total) by mouth daily. For mood 03/01/19 12/25/19  Aldean Baker, NP  traZODone (DESYREL) 50 MG tablet Take 1 tablet (50 mg total) by mouth at bedtime and may repeat dose one time if needed. 02/28/19 12/25/19  Aldean Baker, NP    Family History Family History  Problem Relation Age of Onset  . Restless legs syndrome Mother   . Epilepsy Father     Social History Social History   Tobacco Use  . Smoking status: Never Smoker  . Smokeless tobacco: Never Used  Vaping Use  . Vaping Use: Every day  . Substances: Nicotine, Flavoring  Substance Use Topics  . Alcohol use: No  . Drug use: Yes    Frequency: 14.0 times per week    Types: Marijuana    Comment: 2 blunts twice a day     Allergies   Patient has no known allergies.   Review of Systems Review of Systems  Constitutional: Negative for activity change, appetite change, chills, diaphoresis, fatigue and fever.  HENT: Positive for postnasal drip. Negative  for congestion, ear discharge, ear pain, rhinorrhea and sore throat.   Respiratory: Negative for cough.   Musculoskeletal: Negative for gait problem.  Skin: Negative for rash.  Hematological: Negative for adenopathy.  Psychiatric/Behavioral: Positive for suicidal ideas. Negative for sleep disturbance. The patient is not nervous/anxious.        + for depression Denies active plan     Physical Exam Triage Vital Signs ED Triage Vitals  Enc Vitals Group     BP 04/07/21 1510 120/80     Pulse Rate 04/07/21 1510 73     Resp 04/07/21 1510 18     Temp 04/07/21 1510 98.2 F (36.8 C)     Temp Source 04/07/21 1510 Oral     SpO2 04/07/21 1510 100 %     Weight 04/07/21 1509 155 lb (70.3 kg)     Height  04/07/21 1509 5\' 9"  (1.753 m)     Head Circumference --      Peak Flow --      Pain Score 04/07/21 1509 0     Pain Loc --      Pain Edu? --      Excl. in GC? --    No data found.  Updated Vital Signs BP 120/80 (BP Location: Left Arm)   Pulse 73   Temp 98.2 F (36.8 C) (Oral)   Resp 18   Ht 5\' 9"  (1.753 m)   Wt 155 lb (70.3 kg)   LMP 03/31/2021 (Approximate)   SpO2 100%   BMI 22.89 kg/m   Visual Acuity Right Eye Distance:   Left Eye Distance:   Bilateral Distance:    Right Eye Near:   Left Eye Near:    Bilateral Near:     Physical Exam Vitals signs and nursing note reviewed.  Constitutional:      General: She is not in acute distress.    Appearance: Normal appearance. She is not ill-appearing, toxic-appearing or diaphoretic.  HENT:     Head: Normocephalic.     Right Ear: Tympanic membrane, ear canal and external ear normal.     Left Ear: Tympanic membrane, ear canal and external ear normal.     Nose: Nose normal. Has tenderness on L maxillary > R and L ethmoid sinuses.     Mouth/Throat: clear    Mouth: Mucous membranes are moist.  Eyes:     General: No scleral icterus.       Right eye: No discharge.        Left eye: No discharge.     Conjunctiva/sclera: Conjunctivae normal.  Neck:     Musculoskeletal: Neck supple. No neck rigidity.  Cardiovascular:     Rate and Rhythm: Normal rate and regular rhythm.     Heart sounds: No murmur.  Pulmonary:     Effort: Pulmonary effort is normal.     Breath sounds: Normal breath sounds.    Musculoskeletal: Normal range of motion.  Lymphadenopathy:     Cervical: No cervical adenopathy.  Skin:    General: Skin is warm and dry.     Coloration: Skin is not jaundiced.     Findings: No rash.  Neurological:     Mental Status: She is alert and oriented to person, place, and time.     Gait: Gait normal.  Psychiatric:        Mood and Affect: became tearful when sharing all the stressors she has been dealing with since  childhood, from being bullied to her ex boyfriend cheating on  her with her best friend and feeling betrayed, and her brother dying.         Behavior: Behavior normal.        Thought Content: Thought content normal.        Judgment: Judgment normal.     UC Treatments / Results  Labs (all labs ordered are listed, but only abnormal results are displayed) Labs Reviewed - No data to display  EKG   Radiology No results found.  Procedures Procedures (including critical care time)  Medications Ordered in UC Medications - No data to display  Initial Impression / Assessment and Plan / UC Course  I have reviewed the triage vital signs and the nursing notes. PHQ9 score is 17. 20 minutes spent listening to her stressors. She has no plan to harm herself and has decided to go back to see her prior councilor.  Has purulent post nasal drainage which may be from being a smoker vs sinusitis. I placed her on Augmentin  And advised to quit smoking.    Final Clinical Impressions(s) / UC Diagnoses   Final diagnoses:  Acute non-recurrent maxillary sinusitis     Discharge Instructions     Watch this video who to do saline nose rinses.  [email or web address]   Do not use tap water, only boiled water that has been cooled down.  Do it twice a day  for 5-7 days, but avoid bed time.       ED Prescriptions    Medication Sig Dispense Auth. Provider   amoxicillin-clavulanate (AUGMENTIN) 875-125 MG tablet Take 1 tablet by mouth every 12 (twelve) hours. 14 tablet Rodriguez-Southworth, Nettie Elm, PA-C     PDMP not reviewed this encounter.   Garey Ham, Cordelia Poche 04/07/21 2018

## 2021-04-07 NOTE — ED Triage Notes (Signed)
Patient in today c/o cough and nasal mucous (brown) x 2.5 weeks. Patient had covid 12/2020 and her sense of smell is not back to normal yet. Patient has taken OTC Mucinex and Sudafed without relief. Patient denies fever.

## 2021-04-07 NOTE — Discharge Instructions (Signed)
Watch this video who to do saline nose rinses. ° °[email or web address].bing.com/videos/[email or web address]+neti+pot+video&&view=[email or web address] ° ° °Do not use tap water, only boiled water that has been cooled down.  °Do it twice a day  for 5-7 days, but avoid bed time.  ° ° °

## 2021-07-14 ENCOUNTER — Other Ambulatory Visit: Payer: Self-pay

## 2021-07-14 ENCOUNTER — Encounter: Payer: Self-pay | Admitting: Emergency Medicine

## 2021-07-14 ENCOUNTER — Ambulatory Visit
Admission: EM | Admit: 2021-07-14 | Discharge: 2021-07-14 | Disposition: A | Discharge status: Home / Self Care | Payer: Medicaid Other | Attending: Internal Medicine | Admitting: Internal Medicine

## 2021-07-14 DIAGNOSIS — Z20822 Contact with and (suspected) exposure to covid-19: Secondary | ICD-10-CM | POA: Diagnosis not present

## 2021-07-14 DIAGNOSIS — R059 Cough, unspecified: Secondary | ICD-10-CM | POA: Diagnosis present

## 2021-07-14 DIAGNOSIS — Z79899 Other long term (current) drug therapy: Secondary | ICD-10-CM | POA: Insufficient documentation

## 2021-07-14 DIAGNOSIS — U071 COVID-19: Secondary | ICD-10-CM | POA: Insufficient documentation

## 2021-07-14 NOTE — ED Triage Notes (Signed)
Started feeling bad on Monday.  Patient spent time with some friends a few days ago and was since then told that they were positive for covid.    Patient noticed a cough 4 days ago, head congestion followed.

## 2021-07-14 NOTE — ED Provider Notes (Signed)
[facility]    CSN: [id number] Arrival date & time: 07/14/21  1302      History   Chief Complaint Chief Complaint  Patient presents with   Cough    HPI Christine May is a 20 y.o. female who presents with onset of cough  and nose congestion x 4 days. Was around people whom she found out have covid. Denies fever. 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   Bipolar 1 disorder (HCC)    Depression 2014   Duanes syndrome    Headache     Patient Active Problem List   Diagnosis Date Noted   MDD (major depressive disorder), severe (HCC) 02/26/2019   Deliberate self-cutting    Severe single current episode of major depressive disorder, without psychotic features (HCC)    Severe episode of recurrent major depressive disorder, without psychotic features (HCC)    Nausea with vomiting    Cephalalgia    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      Home Medications    Prior to Admission medications   Medication Sig Start Date End Date Taking? Authorizing Provider  etonogestrel (NEXPLANON) 68 MG IMPL implant 1 each by Subdermal route once.    [provider]  sertraline (ZOLOFT) 25 MG tablet Take 25 mg by mouth daily. 04/18/21   [provider]  escitalopram (LEXAPRO) 5 MG tablet Take 1 tablet (5 mg total) by mouth daily. For mood 03/01/19 12/25/19  Aldean Baker, NP  traZODone (DESYREL) 50 MG tablet Take 1 tablet (50 mg total) by mouth at bedtime and may repeat dose one time if needed. 02/28/19 12/25/19  Aldean Baker, NP    Family History Family History  Problem Relation Age of Onset   Restless legs syndrome Mother     Epilepsy Father     Social History Social History   Tobacco Use   Smoking status: Never   Smokeless tobacco: Never  Vaping Use   Vaping Use: Every day   Substances: Nicotine, Flavoring  Substance Use Topics   Alcohol use: Yes   Drug use: Yes    Frequency: 14.0 times per week    Types: Marijuana    Comment: 2 blunts twice a day     Allergies   Patient has no known allergies.   Review of Systems Review of Systems  Constitutional:  Positive for fatigue. Negative for activity change, appetite change, chills, diaphoresis and fever.  HENT:  Positive for congestion and postnasal drip. Negative for ear discharge, ear pain and sore throat.   Eyes:  Negative for discharge.  Respiratory:  Positive for cough. Negative for chest tightness and shortness of breath.   Gastrointestinal:  Negative for abdominal pain, diarrhea, nausea and vomiting.  Musculoskeletal:  Negative for myalgias.  Skin:  Negative for rash.  Neurological:  Positive for headaches.  Hematological:  Negative for adenopathy.  Psychiatric/Behavioral:         Since I saw her last she went back to her psychiatrist and is back on Sertraline and doing well emotionally     Physical Exam Triage Vital Signs  ED Triage Vitals  Enc Vitals Group     BP 07/14/21 1350 110/68     Pulse Rate 07/14/21 1350 74     Resp --      Temp 07/14/21 1350 98.2 F (36.8 C)     Temp Source 07/14/21 1350 Oral     SpO2 07/14/21 1350 100 %     Weight --      Height --      Head Circumference --      Peak Flow --      Pain Score 07/14/21 1345 0     Pain Loc --      Pain Edu? --      Excl. in GC? --    No data found.  Updated Vital Signs BP 110/68 (BP Location: Right Arm)   Pulse 74   Temp 98.2 F (36.8 C) (Oral)   LMP 07/09/2021   SpO2 100%   Visual Acuity Right Eye Distance:   Left Eye Distance:   Bilateral Distance:    Right Eye Near:   Left Eye Near:    Bilateral Near:     Physical Exam Physical Exam Vitals signs  and nursing note reviewed.  Constitutional:      General: She is not in acute distress.    Appearance: Normal appearance. She is not ill-appearing, toxic-appearing or diaphoretic.  HENT:     Head: Normocephalic.     Right Ear: Tympanic membrane, ear canal and external ear normal.     Left Ear: Tympanic membrane, ear canal and external ear normal.     Nose: Nose normal.     Mouth/Throat:     Mouth: Mucous membranes are moist.  Eyes:     General: No scleral icterus.       Right eye: No discharge.        Left eye: No discharge.     Conjunctiva/sclera: Conjunctivae normal.  Neck:     Musculoskeletal: Neck supple. No neck rigidity.  Cardiovascular:     Rate and Rhythm: Normal rate and regular rhythm.     Heart sounds: No murmur.  Pulmonary:     Effort: Pulmonary effort is normal.     Breath sounds: Normal breath sounds.   Musculoskeletal: Normal range of motion.  Lymphadenopathy:     Cervical: No cervical adenopathy.  Skin:    General: Skin is warm and dry.     Coloration: Skin is not jaundiced.     Findings: No rash.  Neurological:     Mental Status: She is alert and oriented to person, place, and time.     Gait: Gait normal.  Psychiatric:        Mood and Affect: Mood normal.        Behavior: Behavior normal.        Thought Content: Thought content normal.        Judgment: Judgment normal.    UC Treatments / Results  Labs (all labs ordered are listed, but only abnormal results are displayed) Labs Reviewed  SARS CORONAVIRUS 2 (TAT 6-24 HRS)    EKG   Radiology No results found.  Procedures Procedures (including critical care time)  Medications Ordered in UC Medications - No data to display  Initial Impression / Assessment and Plan / UC Course  I have reviewed the triage vital signs and the nursing notes. Covid test pending. Advised to quarantine for 5 days from onset of her symptoms and wear a mask 5 more days strictly when she  is at work.  We will call her  if positive, but she will check on mychart as well.      Final Clinical Impressions(s) / UC Diagnoses   Final diagnoses:  Contact with and (suspected) exposure to covid-19     Discharge Instructions      If your Covid test ends up positive you may take the following supplements to help your immune system be stronger to fight this viral infection Take Quarcetin 500 mg three times a day x 7 days with Zinc 50 mg ones a day x 7 days. The quarcetin is an antiviral and anti-inflammatory supplement which helps open the zinc channels in the cell to absorb Zinc. Zinc helps decrease the virus load in your body. Take Melatonin 6-10 mg at bed time which also helps support your immune system.  Also make sure to take Vit D 5,000 IU per day with a fatty meal and Vit C 5000 mg a day until you are completely better. To prevent viral illnesses your vitamin D should be between 60-80. Stay on Vitamin D 2,000  and C  1000 mg the rest of the season.  Don't lay around, keep active and walk as much as you are able to to prevent worsening of your symptoms.  Follow up with your family Dr next week.  If you get short of breath and you are able to check  your oxygen with a pulse oxygen meter, if it gets to 92% or less, you need to go to the hospital to be admitted. If you dont have one, come back here and we will assess you.       ED Prescriptions   None    PDMP not reviewed this encounter.   Garey Ham, PA-C 07/14/21 1457

## 2021-07-14 NOTE — Discharge Instructions (Addendum)
If your Covid test ends up positive you may take the following supplements to help your immune system be stronger to fight this viral infection Take Quarcetin 500 mg three times a day x 7 days with Zinc 50 mg ones a day x 7 days. The quarcetin is an antiviral and anti-inflammatory supplement which helps open the zinc channels in the cell to absorb Zinc. Zinc helps decrease the virus load in your body. Take Melatonin 6-10 mg at bed time which also helps support your immune system.  Also make sure to take Vit D 5,000 IU per day with a fatty meal and Vit C 5000 mg a day until you are completely better. To prevent viral illnesses your vitamin D should be between 60-80. Stay on Vitamin D 2,000  and C  1000 mg the rest of the season.  Don't lay around, keep active and walk as much as you are able to to prevent worsening of your symptoms.  Follow up with your family Dr next week.  If you get short of breath and you are able to check  your oxygen with a pulse oxygen meter, if it gets to 92% or less, you need to go to the hospital to be admitted. If you dont have one, come back here and we will assess you.   

## 2021-07-15 LAB — SARS CORONAVIRUS 2 (TAT 6-24 HRS): SARS Coronavirus 2: POSITIVE — AB

## 2021-10-07 ENCOUNTER — Other Ambulatory Visit: Payer: Self-pay

## 2021-10-07 ENCOUNTER — Encounter: Payer: Self-pay | Admitting: Emergency Medicine

## 2021-10-07 ENCOUNTER — Ambulatory Visit
Admission: EM | Admit: 2021-10-07 | Discharge: 2021-10-07 | Disposition: A | Discharge status: Home / Self Care | Payer: Medicaid Other | Attending: Medical Oncology | Admitting: Medical Oncology

## 2021-10-07 DIAGNOSIS — Z20822 Contact with and (suspected) exposure to covid-19: Secondary | ICD-10-CM | POA: Insufficient documentation

## 2021-10-07 DIAGNOSIS — R051 Acute cough: Secondary | ICD-10-CM | POA: Diagnosis not present

## 2021-10-07 DIAGNOSIS — H66003 Acute suppurative otitis media without spontaneous rupture of ear drum, bilateral: Secondary | ICD-10-CM | POA: Insufficient documentation

## 2021-10-07 DIAGNOSIS — J0140 Acute pansinusitis, unspecified: Secondary | ICD-10-CM | POA: Insufficient documentation

## 2021-10-07 LAB — RESP PANEL BY RT-PCR (FLU A&B, COVID) ARPGX2
Influenza A by PCR: NEGATIVE
Influenza B by PCR: NEGATIVE
SARS Coronavirus 2 by RT PCR: NEGATIVE

## 2021-10-07 LAB — GROUP A STREP BY PCR: Group A Strep by PCR: NOT DETECTED

## 2021-10-07 MED ORDER — PREDNISONE 10 MG PO TABS: ORAL_TABLET | ORAL | 0 refills | Status: DC

## 2021-10-07 MED ORDER — BENZONATATE 100 MG PO CAPS: 100.0000 mg | ORAL_CAPSULE | Freq: Three times a day (TID) | ORAL | 0 refills | Status: DC

## 2021-10-07 MED ORDER — AMOXICILLIN-POT CLAVULANATE 875-125 MG PO TABS: 1.0000 | ORAL_TABLET | Freq: Two times a day (BID) | ORAL | 0 refills | Status: DC

## 2021-10-07 MED ORDER — FLUTICASONE PROPIONATE 50 MCG/ACT NA SUSP: 2.0000 | Freq: Every day | NASAL | 0 refills | Status: DC

## 2021-10-07 NOTE — ED Provider Notes (Signed)
[facility]    CSN: [id number] Arrival date & time: 10/07/21  1242      History   Chief Complaint Chief Complaint  Patient presents with   Cough   Sore Throat   Nasal Congestion    HPI Christine May is a 20 y.o. female.   HPI  Cold Symptoms: Pt presents with symptoms of sore throat, dry cough, chest congestion and a runny nose. Symptoms started on 10/02/2021 and have progressively gotten worse. She denies fever, SOB, no obvious hemoptysis but has had some bloody noses and scant pink sputum thereafter. She has tried Theraflu/ Dayquil for symptoms with mild relief. She denies sick or TB contacts but works at [employer] where she interacts with the public.    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      Home Medications    Prior to Admission medications   Medication Sig Start Date End Date Taking? Authorizing Provider  sertraline (ZOLOFT) 25 MG tablet Take 25 mg by mouth daily. 04/18/21  Yes [provider]  ESTARYLLA 0.25-35 MG-MCG tablet Take 1 tablet by mouth daily. 08/19/21   [provider]  etonogestrel (NEXPLANON) 68 MG IMPL implant 1 each  by Subdermal route once.    [provider]  escitalopram (LEXAPRO) 5 MG tablet Take 1 tablet (5 mg total) by mouth daily. For mood 03/01/19 12/25/19  Aldean Baker, NP  traZODone (DESYREL) 50 MG tablet Take 1 tablet (50 mg total) by mouth at bedtime and may repeat dose one time if needed. 02/28/19 12/25/19  Aldean Baker, NP    Family History Family History  Problem Relation Age of Onset   Restless legs syndrome Mother    Epilepsy Father     Social History Social History   Tobacco Use   Smoking status: Never   Smokeless tobacco: Never  Vaping Use   Vaping Use: Every day   Substances: Nicotine, Flavoring  Substance Use Topics   Alcohol use: Yes   Drug use: Yes    Frequency: 14.0 times per week    Types: Marijuana    Comment: 2 blunts twice a day     Allergies   Patient has no known allergies.   Review of Systems Review of Systems  As stated above in HPI Physical Exam Triage Vital Signs ED Triage Vitals  Enc Vitals Group     BP 10/07/21 1256 120/76     Pulse Rate 10/07/21 1256 90     Resp 10/07/21 1256 14     Temp 10/07/21 1256 98.6 F (37 C)  Temp Source 10/07/21 1256 Oral     SpO2 10/07/21 1256 97 %     Weight 10/07/21 1252 150 lb (68 kg)     Height 10/07/21 1252 5\' 8"  (1.727 m)     Head Circumference --      Peak Flow --      Pain Score 10/07/21 1251 7     Pain Loc --      Pain Edu? --      Excl. in GC? --    No data found.  Updated Vital Signs BP 120/76 (BP Location: Left Arm)   Pulse 90   Temp 98.6 F (37 C) (Oral)   Resp 14   Ht 5\' 8"  (1.727 m)   Wt 150 lb (68 kg)   LMP 10/02/2021   SpO2 97%   BMI 22.81 kg/m   Physical Exam Vitals and nursing note reviewed.  Constitutional:      General: She is not in acute distress.    Appearance: She is well-developed. She is not ill-appearing, toxic-appearing or diaphoretic.  HENT:     Head: Normocephalic and atraumatic.     Right Ear: A middle ear effusion is present. Tympanic membrane  is erythematous.     Left Ear: A middle ear effusion is present. Tympanic membrane is erythematous.     Nose: Congestion (with maxillary and frontal sinus tenderness and bogginess) present.     Mouth/Throat:     Mouth: Mucous membranes are moist.     Pharynx: Uvula midline. Posterior oropharyngeal erythema (with petichia) present. No oropharyngeal exudate or uvula swelling.     Tonsils: No tonsillar exudate or tonsillar abscesses.  Eyes:     Conjunctiva/sclera: Conjunctivae normal.     Pupils: Pupils are equal, round, and reactive to light.  Cardiovascular:     Rate and Rhythm: Normal rate and regular rhythm.     Heart sounds: Normal heart sounds.  Pulmonary:     Effort: Pulmonary effort is normal.     Breath sounds: Normal breath sounds.  Musculoskeletal:     Cervical back: Normal range of motion and neck supple.  Lymphadenopathy:     Cervical: Cervical adenopathy present.  Skin:    General: Skin is warm.  Neurological:     Mental Status: She is alert and oriented to person, place, and time.     UC Treatments / Results  Labs (all labs ordered are listed, but only abnormal results are displayed) Labs Reviewed  GROUP A STREP BY PCR  RESP PANEL BY RT-PCR (FLU A&B, COVID) [id number]    EKG   Radiology No results found.  Procedures Procedures (including critical care time)  Medications Ordered in UC Medications - No data to display  Initial Impression / Assessment and Plan / UC Course  I have reviewed the triage vital signs and the nursing notes.  Pertinent labs & imaging results that were available during my care of the patient were reviewed by me and considered in my medical decision making (see chart for details).     New. Likely viral progressing to acute otitis media and sinusitis. Treating with low dose prednisone along with flonase, tessalon and augmentin to prevent further illness and infection. Discussed rest and hydration with water. Red flag signs and symptoms  discussed. Follow up PRN.   Final Clinical Impressions(s) / UC Diagnoses   Final diagnoses:  None   Discharge Instructions   None    ED Prescriptions   None    PDMP not reviewed  this encounter.   Rushie Chestnut, [place] 10/07/21 1624

## 2021-10-07 NOTE — ED Triage Notes (Signed)
Patient c/o sore throat, cough, chest congestion and runny nose that started on 10/02/21.  Patient denies fevers.

## 2022-03-13 ENCOUNTER — Other Ambulatory Visit (HOSPITAL_BASED_OUTPATIENT_CLINIC_OR_DEPARTMENT_OTHER): Payer: Self-pay

## 2022-04-26 ENCOUNTER — Ambulatory Visit
Admission: EM | Admit: 2022-04-26 | Discharge: 2022-04-26 | Disposition: A | Discharge status: Home / Self Care | Payer: Medicaid Other | Attending: Family Medicine | Admitting: Family Medicine

## 2022-04-26 ENCOUNTER — Encounter: Payer: Self-pay | Admitting: Emergency Medicine

## 2022-04-26 DIAGNOSIS — N910 Primary amenorrhea: Secondary | ICD-10-CM | POA: Diagnosis present

## 2022-04-26 DIAGNOSIS — Z3201 Encounter for pregnancy test, result positive: Secondary | ICD-10-CM | POA: Diagnosis not present

## 2022-04-26 DIAGNOSIS — N926 Irregular menstruation, unspecified: Secondary | ICD-10-CM | POA: Insufficient documentation

## 2022-04-26 DIAGNOSIS — Z3200 Encounter for pregnancy test, result unknown: Secondary | ICD-10-CM | POA: Diagnosis present

## 2022-04-26 LAB — HCG, QUANTITATIVE, PREGNANCY: hCG, Beta Chain, Quant, S: 42 m[IU]/mL — ABNORMAL HIGH (ref <5)

## 2022-04-26 LAB — PREGNANCY, URINE: Preg Test, Ur: POSITIVE — AB

## 2022-04-26 NOTE — Discharge Instructions (Signed)
The urine pregnancy test here was positive, but the line was also faint here. ? ?We have drawn a blood test for pregnancy.  If it is positive staff will call you.  Call your psychiatrist if it is positive, so they can help you side on what meds to continue ?

## 2022-04-26 NOTE — ED Triage Notes (Addendum)
Pt presents to UC for pregnancy testing. States she took a home pregnancy test that showed a faint line and wants a second test to confirm.  ?

## 2022-04-26 NOTE — ED Provider Notes (Signed)
?[facility] ? ? ? ?CSN: [id number] ?Arrival date & time: 04/26/22  1756 ? ? ?  ? ?History   ?Chief Complaint ?Chief Complaint  ?Patient presents with  ? Possible Pregnancy  ? ? ?HPI ?Christine May is a 21 y.o. female.  ? ? ?Possible Pregnancy ? ?Here for possibly a missed period. ? ?She had been having some lower back discomfort, and was concerned that it might mean she was pregnant.  Last menstrual period was possibly 2, 3, or 4 weeks ago.  It might have been further ago.  She did a home pregnancy test that was faintly positive. ? ?She has had nausea but no vomiting.  She has felt cold this week but no fever is measured.  She has had some congestion for about 2 weeks. ? ?She is on medication for bipolar disorder ? ?Past Medical History:  ?Diagnosis Date  ? ADHD (attention deficit hyperactivity disorder)   ? 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 Bipolar 1 disorder (HCC)   ? Depression 2014  ? Duanes syndrome   ? Headache   ? ? ?Patient Active Problem List  ? Diagnosis Date Noted  ? Possible pregnancy, not yet confirmed 04/26/2022  ? MDD (major depressive disorder), severe (HCC) 02/26/2019  ? Deliberate self-cutting   ? Severe single current episode of major depressive disorder, without psychotic features (HCC)   ? Severe episode of recurrent major depressive disorder, without psychotic features (HCC)   ? Nausea with vomiting   ? Cephalalgia   ? 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 Major depression 11/06/2015  ? ? ?Past Surgical History:  ?Procedure Laterality Date  ? EYE SURGERY    ? TYMPANOSTOMY TUBE PLACEMENT    ? WISDOM TOOTH EXTRACTION    ? ? ?OB History   ?No obstetric history on file. ?  ? ? ? ?Home Medications   ? ?Prior to Admission medications   ?Medication Sig Start Date End Date Taking? Authorizing Provider  ?ESTARYLLA 0.25-35 MG-MCG tablet Take 1 tablet by mouth daily. 08/19/21   [provider]  ?etonogestrel (NEXPLANON) 68 MG IMPL implant 1 each by Subdermal route  once.    [provider]  ?fluticasone (FLONASE) 50 MCG/ACT nasal spray Place 2 sprays into both nostrils daily. 10/07/21   Rushie Chestnut, PA-C  ?sertraline (ZOLOFT) 25 MG tablet Take 25 mg by mouth daily. 04/18/21   [provider]  ?escitalopram (LEXAPRO) 5 MG tablet Take 1 tablet (5 mg total) by mouth daily. For mood 03/01/19 12/25/19  Aldean Baker, NP  ?traZODone (DESYREL) 50 MG tablet Take 1 tablet (50 mg total) by mouth at bedtime and may repeat dose one time if needed. 02/28/19 12/25/19  Aldean Baker, NP  ? ? ?Family History ?Family History  ?Problem Relation Age of Onset  ? Restless legs syndrome Mother   ? Epilepsy Father   ? ? ?Social History ?Social History  ? ?Tobacco Use  ? Smoking status: Never  ? Smokeless tobacco: Never  ?Vaping Use  ? Vaping Use: Every day  ? Substances: Nicotine, Flavoring  ?Substance Use Topics  ? Alcohol use: Yes  ? Drug use: Yes  ?  Frequency: 14.0 times per week  ?  Types: Marijuana  ?  Comment: 2 blunts twice a day  ? ? ? ?Allergies   ?Patient has no known allergies. ? ? ?Review of Systems ?Review of Systems ? ? ?Physical Exam ?Triage Vital Signs ?ED Triage Vitals  ?Enc Vitals  Group  ?   BP 04/26/22 1805 117/82  ?   Pulse Rate 04/26/22 1805 91  ?   Resp 04/26/22 1805 18  ?   Temp 04/26/22 1805 99 ?F (37.2 ?C)  ?   Temp Source 04/26/22 1805 Oral  ?   SpO2 04/26/22 1805 97 %  ?   Weight 04/26/22 1803 149 lb 14.6 oz (68 kg)  ?   Height 04/26/22 1803 5\' 8"  (1.727 m)  ?   Head Circumference --   ?   Peak Flow --   ?   Pain Score 04/26/22 1803 0  ?   Pain Loc --   ?   Pain Edu? --   ?   Excl. in [place]? --   ? ?No data found. ? ?Updated Vital Signs ?BP 117/82 (BP Location: Left Arm)   Pulse 91   Temp 99 ?F (37.2 ?C) (Oral)   Resp 18   Ht 5\' 8"  (1.727 m)   Wt 68 kg   LMP 04/10/2022 (Approximate)   SpO2 97%   BMI 22.79 kg/m?  ? ?Visual Acuity ?Right Eye Distance:   ?Left Eye Distance:   ?Bilateral Distance:   ? ?Right Eye Near:   ?Left Eye Near:    ?Bilateral  Near:    ? ?Physical Exam ?Vitals reviewed.  ?Constitutional:   ?   General: She is not in acute distress. ?   Appearance: She is not toxic-appearing.  ?HENT:  ?   Nose: Nose normal.  ?   Mouth/Throat:  ?   Mouth: Mucous membranes are moist.  ?Eyes:  ?   Extraocular Movements: Extraocular movements intact.  ?   Conjunctiva/sclera: Conjunctivae normal.  ?   Pupils: Pupils are equal, round, and reactive to light.  ?Cardiovascular:  ?   Rate and Rhythm: Normal rate and regular rhythm.  ?   Heart sounds: No murmur heard. ?Pulmonary:  ?   Effort: Pulmonary effort is normal.  ?   Breath sounds: Normal breath sounds.  ?Abdominal:  ?   Palpations: Abdomen is soft.  ?   Tenderness: There is no abdominal tenderness.  ?Musculoskeletal:  ?   Cervical back: Neck supple.  ?Lymphadenopathy:  ?   Cervical: No cervical adenopathy.  ?Skin: ?   Coloration: Skin is not jaundiced or pale.  ?Neurological:  ?   Mental Status: She is oriented to person, place, and time.  ?Psychiatric:     ?   Behavior: Behavior normal.  ? ? ? ?UC Treatments / Results  ?Labs ?(all labs ordered are listed, but only abnormal results are displayed) ?Labs Reviewed  ?PREGNANCY, URINE - Abnormal; Notable for the following components:  ?    Result Value  ? Preg Test, Ur POSITIVE (*)   ? All other components within normal limits  ?HCG, QUANTITATIVE, PREGNANCY  ? ? ?EKG ? ? ?Radiology ?No results found. ? ?Procedures ?Procedures (including critical care time) ? ?Medications Ordered in UC ?Medications - No data to display ? ?Initial Impression / Assessment and Plan / UC Course  ?I have reviewed the triage vital signs and the nursing notes. ? ?Pertinent labs & imaging results that were available during my care of the patient were reviewed by me and considered in my medical decision making (see chart for details). ? ?  ? ?UPT here is positive, but lab tech showed us that the test here only showed a very faint line also. We will do a serum preg here to confirm. If  positive, she will call her psychiatrist to help her decide what meds to continue. Givencontact info for obgyn in the area. ?Final Clinical Impressions(s) / UC Diagnoses  ? ?Final diagnoses:  ?Possible pregnancy, not yet confirmed  ?Delayed period  ?Missed period  ?Positive pregnancy test  ? ? ? ?Discharge Instructions   ? ?  ?The urine pregnancy test here was positive, but the line was also faint here. ? ?We have drawn a blood test for pregnancy.  If it is positive staff will call you.  Call your psychiatrist if it is positive, so they can help you side on what meds to continue ? ? ? ? ?ED Prescriptions   ?None ?  ? ?PDMP not reviewed this encounter. ?  ?Zenia Resides, MD ?04/26/22 1839 ? ?

## 2023-01-24 ENCOUNTER — Encounter: Payer: Self-pay | Admitting: Emergency Medicine

## 2023-01-24 ENCOUNTER — Ambulatory Visit
Admission: EM | Admit: 2023-01-24 | Discharge: 2023-01-24 | Disposition: A | Discharge status: Home / Self Care | Payer: Medicaid Other | Attending: Family Medicine | Admitting: Family Medicine

## 2023-01-24 DIAGNOSIS — F17290 Nicotine dependence, other tobacco product, uncomplicated: Secondary | ICD-10-CM | POA: Insufficient documentation

## 2023-01-24 DIAGNOSIS — Z1152 Encounter for screening for COVID-19: Secondary | ICD-10-CM | POA: Insufficient documentation

## 2023-01-24 DIAGNOSIS — J029 Acute pharyngitis, unspecified: Secondary | ICD-10-CM | POA: Diagnosis present

## 2023-01-24 DIAGNOSIS — J101 Influenza due to other identified influenza virus with other respiratory manifestations: Secondary | ICD-10-CM

## 2023-01-24 LAB — RAPID INFLUENZA A&B ANTIGENS
Influenza A (ARMC): POSITIVE — AB
Influenza B (ARMC): NEGATIVE

## 2023-01-24 LAB — GROUP A STREP BY PCR: Group A Strep by PCR: NOT DETECTED

## 2023-01-24 LAB — SARS CORONAVIRUS 2 BY RT PCR: SARS Coronavirus 2 by RT PCR: NEGATIVE

## 2023-01-24 MED ORDER — OSELTAMIVIR PHOSPHATE 75 MG PO CAPS: 75.0000 mg | ORAL_CAPSULE | Freq: Two times a day (BID) | ORAL | 0 refills | Status: DC

## 2023-01-24 MED ORDER — IBUPROFEN 800 MG PO TABS
800.0000 mg | ORAL_TABLET | Freq: Once | ORAL | Status: AC
  Administered 2023-01-24: 800 mg via ORAL

## 2023-01-24 MED ORDER — ONDANSETRON 4 MG PO TBDP
4.0000 mg | ORAL_TABLET | Freq: Once | ORAL | Status: AC
  Administered 2023-01-24: 4 mg via ORAL

## 2023-01-24 MED ORDER — ONDANSETRON 8 MG PO TBDP: 8.0000 mg | ORAL_TABLET | Freq: Three times a day (TID) | ORAL | 0 refills | Status: DC | PRN

## 2023-01-24 NOTE — ED Provider Notes (Signed)
[facility]    CSN: [id number] Arrival date & time: 01/24/23  1230      History   Chief Complaint Chief Complaint  Patient presents with   Sore Throat    HPI Christine May is a 22 y.o. female.   HPI   Christine May presents for sore throat, fever (Tmax 101), nasal and chest congestion, rhinorrhea, vomiting and headache. Endorses chest discomfort with breathing. Denies diarrhea. Feels like she doesn't have any energy. She went to the club for the first time the day before her symptoms started. No known sick contacts.       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   Bipolar 1 disorder ([place])    Depression 2014   Duanes syndrome    Headache     Patient Active Problem List   Diagnosis Date Noted   Possible pregnancy, not yet confirmed 04/26/2022   MDD (major depressive disorder), severe ([place]) 02/26/2019   Deliberate self-cutting    Severe single current episode of major depressive disorder, without psychotic features ([place])    Severe episode of recurrent major depressive disorder, without psychotic features ([place])    Nausea with vomiting    Cephalalgia    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      Home Medications    Prior to Admission medications   Medication Sig Start Date End Date Taking? Authorizing Provider  hydrOXYzine (ATARAX) 10 MG tablet Take 10 mg by mouth 3 (three) times daily as needed.   Yes [provider]  ondansetron (ZOFRAN-ODT) 8 MG disintegrating tablet Take 1 tablet (8 mg total) by mouth every 8 (eight) hours as needed. 01/24/23  Yes Maela Takeda, Ronnette Juniper, DO  oseltamivir (TAMIFLU) 75 MG capsule Take 1 capsule (75 mg total) by  mouth every 12 (twelve) hours. 01/24/23  Yes Aloysuis Ribaudo, DO  sertraline (ZOLOFT) 25 MG tablet Take 25 mg by mouth daily. 04/18/21  Yes [provider]  ESTARYLLA 0.25-35 MG-MCG tablet Take 1 tablet by mouth daily. 08/19/21   [provider]  etonogestrel (NEXPLANON) 68 MG IMPL implant 1 each by Subdermal route once.    [provider]  fluticasone (FLONASE) 50 MCG/ACT nasal spray Place 2 sprays into both nostrils daily. 10/07/21   Hughie Closs, PA-C  escitalopram (LEXAPRO) 5 MG tablet Take 1 tablet (5 mg total) by mouth daily. For mood 03/01/19 12/25/19  Connye Burkitt, NP  traZODone (DESYREL) 50 MG tablet Take 1 tablet (50 mg total) by mouth at bedtime and may repeat dose one time if needed. 02/28/19 12/25/19  Connye Burkitt, NP    Family History Family History  Problem Relation Age of Onset   Restless legs syndrome Mother    Epilepsy Father     Social History Social History   Tobacco Use   Smoking status: Never   Smokeless tobacco: Never  Vaping Use   Vaping Use: Every day   Substances: Nicotine, Flavoring  Substance Use Topics   Alcohol use: Yes   Drug use: Yes    Frequency: 14.0 times per week    Types: Marijuana    Comment: 2 blunts twice a  day     Allergies   Patient has no known allergies.   Review of Systems Review of Systems: negative unless otherwise stated in HPI.      Physical Exam Triage Vital Signs ED Triage Vitals  Enc Vitals Group     BP --      Pulse --      Resp --      Temp --      Temp src --      SpO2 --      Weight 01/24/23 1256 149 lb 14.6 oz (68 kg)     Height 01/24/23 1256 5\' 8"  (1.727 m)     Head Circumference --      Peak Flow --      Pain Score 01/24/23 1255 7     Pain Loc --      Pain Edu? --      Excl. in [place]? --    No data found.  Updated Vital Signs BP 122/81 (BP Location: Right Arm)   Pulse 89   Temp 100.1 F (37.8 C) (Oral)   Resp 16   Ht 5\' 8"  (1.727 m)   Wt 68 kg   LMP 01/14/2023  (Approximate)   SpO2 97%   BMI 22.79 kg/m   Visual Acuity Right Eye Distance:   Left Eye Distance:   Bilateral Distance:    Right Eye Near:   Left Eye Near:    Bilateral Near:     Physical Exam GEN:     alert, ill but non-toxic appearing female in no distress    HENT:  mucus membranes moist, oropharyngeal without lesions or exudate, no tonsillar hypertrophy, clear nasal discharge EYES:   pupils equal and reactive, no scleral injection or discharge NECK:  normal ROM, no meningismus   RESP:  no increased work of breathing, clear to auscultation bilaterally CVS:   regular rate and rhythm Skin:   warm and dry, no rash on visible skin    UC Treatments / Results  Labs (all labs ordered are listed, but only abnormal results are displayed) Labs Reviewed  RAPID INFLUENZA A&B ANTIGENS - Abnormal; Notable for the following components:      Result Value   Influenza A ([facility]) POSITIVE (*)    All other components within normal limits  GROUP A STREP BY PCR  SARS CORONAVIRUS 2 BY RT PCR    EKG   Radiology No results found.  Procedures Procedures (including critical care time)  Medications Ordered in UC Medications  ondansetron (ZOFRAN-ODT) disintegrating tablet 4 mg (4 mg Oral Given 01/24/23 1341)  ondansetron (ZOFRAN-ODT) disintegrating tablet 4 mg (4 mg Oral Given 01/24/23 1341)  ibuprofen (ADVIL) tablet 800 mg (800 mg Oral Given 01/24/23 1341)    Initial Impression / Assessment and Plan / UC Course  I have reviewed the triage vital signs and the nursing notes.  Pertinent labs & imaging results that were available during my care of the patient were reviewed by me and considered in my medical decision making (see chart for details).       Pt is a 22 y.o. female who presents for 2 days of respiratory symptoms. [name] have elevated temperature here, 100.1 F.  Given Tylenol. Satting well on room air. Overall pt is ill but non-toxic appearing, well hydrated, without respiratory  distress. Pulmonary exam is unremarkable.  Strep PCR negative. COVID and influenza testing obtained. History consistent with viral   illness. She is influenza A positive. Tamiflu discussed and  she is within the treatment window. Zofran and Motrin given here. Zofran sent to pharmacy. Discussed symptomatic treatment.  Typical duration of symptoms discussed.   Return and ED precautions given and voiced understanding. Discussed MDM, treatment plan and plan for follow-up with patient and her mom who agree with plan.     Final Clinical Impressions(s) / UC Diagnoses   Final diagnoses:  Influenza A     Discharge Instructions      Your influnza A test is positive. Your strep and COVID tests are negative. Stop by the pharmacy to pick up your prescriptions.    You can take Tylenol and/or Ibuprofen as needed for fever reduction and pain relief.    For cough: honey 1/2 to 1 teaspoon (you can dilute the honey in water or another fluid).  You can also use guaifenesin and dextromethorphan for cough. You can use a humidifier for chest congestion and cough.  If you don't have a humidifier, you can sit in the bathroom with the hot shower running.      For sore throat: try warm salt water gargles, Mucinex sore throat cough drops or cepacol lozenges, throat spray, warm tea or water with lemon/honey, popsicles or ice, or OTC cold relief medicine for throat discomfort. You can also purchase chloraseptic spray at the pharmacy or dollar store.   For congestion: take a daily anti-histamine like Zyrtec, Claritin, and a oral decongestant, such as pseudoephedrine.  You can also use Flonase 1-2 sprays in each nostril daily. Afrin is also a good option, if you do not have high blood pressure.    It is important to stay hydrated: drink plenty of fluids (water, gatorade/powerade/pedialyte, juices, or teas) to keep your throat moisturized and help further relieve irritation/discomfort.    Return or go to the Emergency  Department if symptoms worsen or do not improve in the next few days      ED Prescriptions     Medication Sig Dispense Auth. Provider   oseltamivir (TAMIFLU) 75 MG capsule Take 1 capsule (75 mg total) by mouth every 12 (twelve) hours. 10 capsule Lincoln Kleiner, DO   ondansetron (ZOFRAN-ODT) 8 MG disintegrating tablet Take 1 tablet (8 mg total) by mouth every 8 (eight) hours as needed. 20 tablet Lyndee Hensen, DO      PDMP not reviewed this encounter.   Lyndee Hensen, DO 01/24/23 1343

## 2023-01-24 NOTE — Discharge Instructions (Addendum)
Your influnza A test is positive. Your strep and COVID tests are negative. Stop by the pharmacy to pick up your prescriptions.    You can take Tylenol and/or Ibuprofen as needed for fever reduction and pain relief.    For cough: honey 1/2 to 1 teaspoon (you can dilute the honey in water or another fluid).  You can also use guaifenesin and dextromethorphan for cough. You can use a humidifier for chest congestion and cough.  If you don't have a humidifier, you can sit in the bathroom with the hot shower running.      For sore throat: try warm salt water gargles, Mucinex sore throat cough drops or cepacol lozenges, throat spray, warm tea or water with lemon/honey, popsicles or ice, or OTC cold relief medicine for throat discomfort. You can also purchase chloraseptic spray at the pharmacy or dollar store.   For congestion: take a daily anti-histamine like Zyrtec, Claritin, and a oral decongestant, such as pseudoephedrine.  You can also use Flonase 1-2 sprays in each nostril daily. Afrin is also a good option, if you do not have high blood pressure.    It is important to stay hydrated: drink plenty of fluids (water, gatorade/powerade/pedialyte, juices, or teas) to keep your throat moisturized and help further relieve irritation/discomfort.    Return or go to the Emergency Department if symptoms worsen or do not improve in the next few days

## 2023-01-24 NOTE — ED Triage Notes (Signed)
Pt c/o sore throat, fever, nasal congestion, chest congestion, vomiting, headache, and fever (101). Started about 2 days ago.

## 2023-03-16 ENCOUNTER — Encounter: Payer: Self-pay | Admitting: Emergency Medicine

## 2023-03-16 ENCOUNTER — Ambulatory Visit
Admission: EM | Admit: 2023-03-16 | Discharge: 2023-03-16 | Disposition: A | Discharge status: Home / Self Care | Payer: Medicaid Other | Attending: Physician Assistant | Admitting: Physician Assistant

## 2023-03-16 DIAGNOSIS — N3000 Acute cystitis without hematuria: Secondary | ICD-10-CM | POA: Diagnosis present

## 2023-03-16 DIAGNOSIS — B9689 Other specified bacterial agents as the cause of diseases classified elsewhere: Secondary | ICD-10-CM | POA: Diagnosis present

## 2023-03-16 DIAGNOSIS — N760 Acute vaginitis: Secondary | ICD-10-CM | POA: Diagnosis not present

## 2023-03-16 LAB — WET PREP, GENITAL
Sperm: NONE SEEN
Trich, Wet Prep: NONE SEEN
WBC, Wet Prep HPF POC: <10 — AB (ref <10)
Yeast Wet Prep HPF POC: NONE SEEN

## 2023-03-16 LAB — URINALYSIS, W/ REFLEX TO CULTURE (INFECTION SUSPECTED)
Glucose, UA: NEGATIVE mg/dL
Nitrite: POSITIVE — AB
Protein, ur: >300 mg/dL — AB
Specific Gravity, Urine: >1.03 — ABNORMAL HIGH (ref 1.005–1.030)
Squamous Epithelial / HPF: >50 /HPF (ref 0–5)
pH: 6 (ref 5.0–8.0)

## 2023-03-16 LAB — PREGNANCY, URINE: Preg Test, Ur: NEGATIVE

## 2023-03-16 MED ORDER — NITROFURANTOIN MONOHYD MACRO 100 MG PO CAPS: 100.0000 mg | ORAL_CAPSULE | Freq: Two times a day (BID) | ORAL | 0 refills | Status: DC

## 2023-03-16 MED ORDER — METRONIDAZOLE 500 MG PO TABS: 500.0000 mg | ORAL_TABLET | Freq: Two times a day (BID) | ORAL | 0 refills | Status: AC

## 2023-03-16 NOTE — ED Triage Notes (Signed)
Patient c/o lower pelvic discomfort and burning after she urinates.  Patient has never had a PAP smear.  Patient denies vaginal discharge.

## 2023-03-16 NOTE — Discharge Instructions (Addendum)
UTI: Based on either symptoms or urinalysis, you may have a urinary tract infection. We will send the urine for culture and call with results in a few days. Begin antibiotics at this time. Your symptoms should be much improved over the next 2-3 days. Increase rest and fluid intake. If for some reason symptoms are worsening or not improving after a couple of days or the urine culture determines the antibiotics you are taking will not treat the infection, the antibiotics may be changed. Return or go to ER for fever, back pain, worsening urinary pain, discharge, increased blood in urine. May take Tylenol or Motrin OTC for pain relief or consider AZO if no contraindications   The most common types of vaginal infections are yeast infections and bacterial vaginosis. Neither of which are really considered to be sexually transmitted. Often a pH swab or wet prep is performed and if abnormal may reveal either type of infection. Begin metronidazole if prescribed for possible BV infection. If there is concern for yeast infection, fluconazole is often prescribed . Take this as directed. You may also apply topical miconazole (can be purchased OTC) externally for relief of itching. Increase rest and fluid intake. If labs sent out, we will call within 2-5 days with results and amend treatment if necessary. Always try to use pH balanced washes/wipes, urinate after intercourse, stay hydrated, and take probiotics if you are prone to vaginal infections. Return or see PCP or gynecologist for new/worsening infections.   

## 2023-03-16 NOTE — ED Provider Notes (Signed)
[facility]    CSN: [id number] Arrival date & time: 03/16/23  1236      History   Chief Complaint Chief Complaint  Patient presents with   Dysuria    HPI Christine May is a 22 y.o. female presenting for lower abdominal/pelvic cramping, burning after urination, frequency, urgency and foul-smelling odor to urine for the past couple of days.  Denies vaginal discharge, itching or odor.  No concern for STIs.  She is sexually active with 1 female partner.  She says they use condoms.  No concern for pregnancy.  LMP 02/14/2023.  Patient reports that she started using sex toys recently and has inserted them anally first and then vaginally the second at times.  She reports that she has cleaned them after she uses them.  No other complaints.  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   Bipolar 1 disorder ([place])    Depression 2014   Duanes syndrome    Headache     Patient Active Problem List   Diagnosis Date Noted   Possible pregnancy, not yet confirmed 04/26/2022   MDD (major depressive disorder), severe ([place]) 02/26/2019   Deliberate self-cutting    Severe single current episode of major depressive disorder, without psychotic features ([place])    Severe episode of recurrent major depressive disorder, without psychotic features ([place])    Nausea with vomiting    Cephalalgia    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      Home Medications    Prior to Admission medications   Medication Sig Start Date End Date Taking? Authorizing Provider  buPROPion (WELLBUTRIN) 75 MG tablet Take 75 mg by mouth every morning. 02/21/23  Yes [provider]  metroNIDAZOLE  (FLAGYL) 500 MG tablet Take 1 tablet (500 mg total) by mouth 2 (two) times daily for 7 days. 03/16/23 03/23/23 Yes Danton Clap, PA-C  nitrofurantoin, macrocrystal-monohydrate, (MACROBID) 100 MG capsule Take 1 capsule (100 mg total) by mouth 2 (two) times daily. 03/16/23  Yes Laurene Footman B, PA-C  sertraline (ZOLOFT) 25 MG tablet Take 25 mg by mouth daily. 04/18/21  Yes [provider]  ESTARYLLA 0.25-35 MG-MCG tablet Take 1 tablet by mouth daily. 08/19/21   [provider]  etonogestrel (NEXPLANON) 68 MG IMPL implant 1 each by Subdermal route once.    [provider]  fluticasone (FLONASE) 50 MCG/ACT nasal spray Place 2 sprays into both nostrils daily. 10/07/21   Hughie Closs, PA-C  hydrOXYzine (ATARAX) 10 MG tablet Take 10 mg by mouth 3 (three) times daily as needed.    [provider]  ondansetron (ZOFRAN-ODT) 8 MG disintegrating tablet Take 1 tablet (8 mg total) by mouth every 8 (eight) hours as needed. 01/24/23   Lyndee Hensen, DO  oseltamivir (TAMIFLU) 75 MG capsule Take 1 capsule (75 mg total) by mouth every 12 (twelve) hours. 01/24/23   Brimage, Ronnette Juniper, DO  escitalopram (LEXAPRO) 5 MG tablet Take 1 tablet (5 mg total) by mouth daily. For mood 03/01/19 12/25/19  Connye Burkitt, NP  traZODone (DESYREL) 50 MG tablet Take 1 tablet (50 mg total) by mouth at bedtime  and may repeat dose one time if needed. 02/28/19 12/25/19  Connye Burkitt, NP    Family History Family History  Problem Relation Age of Onset   Restless legs syndrome Mother    Epilepsy Father     Social History Social History   Tobacco Use   Smoking status: Never   Smokeless tobacco: Never  Vaping Use   Vaping Use: Every day   Substances: Nicotine, Flavoring  Substance Use Topics   Alcohol use: Yes   Drug use: Yes    Frequency: 14.0 times per week    Types: Marijuana    Comment: 2 blunts twice a day     Allergies   Patient has no known allergies.   Review of Systems Review of  Systems  Constitutional:  Negative for chills, fatigue and fever.  Gastrointestinal:  Positive for abdominal pain. Negative for diarrhea, nausea and vomiting.  Genitourinary:  Positive for dysuria, frequency and urgency. Negative for decreased urine volume, flank pain, hematuria, pelvic pain, vaginal bleeding, vaginal discharge and vaginal pain.  Musculoskeletal:  Negative for back pain.  Skin:  Negative for rash.     Physical Exam Triage Vital Signs ED Triage Vitals  Enc Vitals Group     BP      Pulse      Resp      Temp      Temp src      SpO2      Weight      Height      Head Circumference      Peak Flow      Pain Score      Pain Loc      Pain Edu?      Excl. in [place]?    No data found.  Updated Vital Signs BP 128/84 (BP Location: Right Arm)   Pulse 89   Temp 99.5 F (37.5 C) (Oral)   Resp 15   Ht 5\' 8"  (1.727 m)   Wt 149 lb 14.6 oz (68 kg)   LMP 02/14/2023 (Approximate)   SpO2 96%   BMI 22.79 kg/m      Physical Exam Vitals and nursing note reviewed.  Constitutional:      General: She is not in acute distress.    Appearance: Normal appearance. She is not ill-appearing or toxic-appearing.  HENT:     Head: Normocephalic and atraumatic.  Eyes:     General: No scleral icterus.       Right eye: No discharge.        Left eye: No discharge.     Conjunctiva/sclera: Conjunctivae normal.  Cardiovascular:     Rate and Rhythm: Normal rate and regular rhythm.     Heart sounds: Normal heart sounds.  Pulmonary:     Effort: Pulmonary effort is normal. No respiratory distress.     Breath sounds: Normal breath sounds.  Abdominal:     Palpations: Abdomen is soft.     Tenderness: There is abdominal tenderness (suprapubic). There is no right CVA tenderness or left CVA tenderness.  Musculoskeletal:     Cervical back: Neck supple.  Skin:    General: Skin is dry.  Neurological:     General: No focal deficit present.     Mental Status: She is alert. Mental status is at  baseline.     Motor: No weakness.     Gait: Gait normal.  Psychiatric:        Mood and Affect: Mood normal.  Behavior: Behavior normal.        Thought Content: Thought content normal.      UC Treatments / Results  Labs (all labs ordered are listed, but only abnormal results are displayed) Labs Reviewed  WET PREP, GENITAL - Abnormal; Notable for the following components:      Result Value   Clue Cells Wet Prep HPF POC PRESENT (*)    WBC, Wet Prep HPF POC <10 (*)    All other components within normal limits  URINALYSIS, W/ REFLEX TO CULTURE (INFECTION SUSPECTED) - Abnormal; Notable for the following components:   APPearance HAZY (*)    Specific Gravity, Urine >1.030 (*)    Hgb urine dipstick MODERATE (*)    Bilirubin Urine SMALL (*)    Ketones, ur TRACE (*)    Protein, ur >300 (*)    Nitrite POSITIVE (*)    Leukocytes,Ua TRACE (*)    Bacteria, UA MANY (*)    All other components within normal limits  PREGNANCY, URINE    EKG   Radiology No results found.  Procedures Procedures (including critical care time)  Medications Ordered in UC Medications - No data to display  Initial Impression / Assessment and Plan / UC Course  I have reviewed the triage vital signs and the nursing notes.  Pertinent labs & imaging results that were available during my care of the patient were reviewed by me and considered in my medical decision making (see chart for details).   22 year old female presents for pelvic cramping, lower abdominal pain, dysuria, frequency, urgency for the past couple days.  Urine sample collected and patient collected wet prep.  Urinalysis obtained today shows greater than 1.030 specific gravity, moderate blood, small bili, trace ketones, protein, positive nitrites, leukocytes, many bacteria.  Will send urine for culture.  Wet prep positive for clue cells.  Acute UTI and bacterial vaginosis.  Treating with Macrobid and metronidazole.  Discussed cleaning  sex toys after each use.  Also advised not to use anally and then vaginally.  Explained that is likely the cause of her current infections.  Encouraged increasing rest and fluids.  Reviewed return and ED precautions.  Advised patient to follow-up with OB/GYN for first Pap smear as she has never had one.  Final Clinical Impressions(s) / UC Diagnoses   Final diagnoses:  Acute cystitis without hematuria  Bacterial vaginosis     Discharge Instructions      UTI: Based on either symptoms or urinalysis, you may have a urinary tract infection. We will send the urine for culture and call with results in a few days. Begin antibiotics at this time. Your symptoms should be much improved over the next 2-3 days. Increase rest and fluid intake. If for some reason symptoms are worsening or not improving after a couple of days or the urine culture determines the antibiotics you are taking will not treat the infection, the antibiotics may be changed. Return or go to ER for fever, back pain, worsening urinary pain, discharge, increased blood in urine. May take Tylenol or Motrin OTC for pain relief or consider AZO if no contraindications   The most common types of vaginal infections are yeast infections and bacterial vaginosis. Neither of which are really considered to be sexually transmitted. Often a pH swab or wet prep is performed and if abnormal may reveal either type of infection. Begin metronidazole if prescribed for possible BV infection. If there is concern for yeast infection, fluconazole is often prescribed . Take this  as directed. You may also apply topical miconazole (can be purchased OTC) externally for relief of itching. Increase rest and fluid intake. If labs sent out, we will call within 2-5 days with results and amend treatment if necessary. Always try to use pH balanced washes/wipes, urinate after intercourse, stay hydrated, and take probiotics if you are prone to vaginal infections. Return or see PCP  or gynecologist for new/worsening infections.       ED Prescriptions     Medication Sig Dispense Auth. Provider   nitrofurantoin, macrocrystal-monohydrate, (MACROBID) 100 MG capsule Take 1 capsule (100 mg total) by mouth 2 (two) times daily. 10 capsule Laurene Footman B, PA-C   metroNIDAZOLE (FLAGYL) 500 MG tablet Take 1 tablet (500 mg total) by mouth 2 (two) times daily for 7 days. 14 tablet Gretta Cool      PDMP not reviewed this encounter.   Danton Clap, PA-C 03/16/23 1341

## 2023-03-17 ENCOUNTER — Ambulatory Visit: Payer: Self-pay

## 2023-08-01 ENCOUNTER — Ambulatory Visit
Admission: EM | Admit: 2023-08-01 | Discharge: 2023-08-01 | Disposition: A | Discharge status: Home / Self Care | Payer: Medicaid Other | Attending: Emergency Medicine | Admitting: Emergency Medicine

## 2023-08-01 DIAGNOSIS — Z3202 Encounter for pregnancy test, result negative: Secondary | ICD-10-CM

## 2023-08-01 DIAGNOSIS — N926 Irregular menstruation, unspecified: Secondary | ICD-10-CM | POA: Diagnosis not present

## 2023-08-01 DIAGNOSIS — R801 Persistent proteinuria, unspecified: Secondary | ICD-10-CM

## 2023-08-01 LAB — URINALYSIS, W/ REFLEX TO CULTURE (INFECTION SUSPECTED)
Glucose, UA: NEGATIVE mg/dL
Hgb urine dipstick: NEGATIVE
Ketones, ur: NEGATIVE mg/dL
Leukocytes,Ua: NEGATIVE
Nitrite: NEGATIVE
Protein, ur: 30 mg/dL — AB
Specific Gravity, Urine: >1.03 — ABNORMAL HIGH (ref 1.005–1.030)
pH: 6 (ref 5.0–8.0)

## 2023-08-01 LAB — PREGNANCY, URINE: Preg Test, Ur: NEGATIVE

## 2023-08-01 NOTE — Discharge Instructions (Addendum)
Your pregnancy test is negative.   Your urinalysis is negative for uti, you have mild protein in your urine(chronic). Please follow up with PCP.  Rest,push fluids, follow up with PCP/GYN for further evaluation of irregular periods.

## 2023-08-01 NOTE — ED Triage Notes (Signed)
Pt presents to UC for poss pregnancy. LMP:07/01/23. Took home test last wk which was neg.

## 2023-08-01 NOTE — ED Provider Notes (Signed)
[facility]    CSN: [id number] Arrival date & time: 08/01/23  1922      History   Chief Complaint Chief Complaint  Patient presents with   Possible Pregnancy    HPI Christine May is a 23 y.o. female.   Christine May, 22 year old female, presents to urgent care for pregnancy test. Pt states LMP was 07/01/23.   Pt denies any complaints at present, no dysuria, no abdominal pain, vaginal discharge. Pt is "trying to get pregnant".  The history is provided by the patient. No language interpreter was used.    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   Bipolar 1 disorder ([facility])    Depression 2014   Duanes syndrome    Headache     Patient Active Problem List   Diagnosis Date Noted   Urine pregnancy test negative 08/01/2023   Irregular periods/menstrual cycles 08/01/2023   Persistent proteinuria 08/01/2023   Possible pregnancy, not yet confirmed 04/26/2022   MDD (major depressive disorder), severe ([facility]) 02/26/2019   Deliberate self-cutting    Severe single current episode of major depressive disorder, without psychotic features ([facility])    Severe episode of recurrent major depressive disorder, without psychotic features ([facility])    Nausea with vomiting    Cephalalgia    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      Home Medications    Prior to Admission medications   Medication Sig Start Date End Date Taking? Authorizing Provider  buPROPion (WELLBUTRIN) 75 MG tablet Take 75 mg by mouth every morning. 02/21/23  Yes [provider]  ESTARYLLA 0.25-35 MG-MCG tablet Take 1 tablet by mouth daily. 08/19/21  Yes [provider]  hydrOXYzine  (ATARAX) 10 MG tablet Take 10 mg by mouth 3 (three) times daily as needed.   Yes [provider]  nitrofurantoin, macrocrystal-monohydrate, (MACROBID) 100 MG capsule Take 1 capsule (100 mg total) by mouth 2 (two) times daily. 03/16/23  Yes Eusebio Friendly B, PA-C  ondansetron (ZOFRAN-ODT) 8 MG disintegrating tablet Take 1 tablet (8 mg total) by mouth every 8 (eight) hours as needed. 01/24/23  Yes Brimage, Seward Meth, DO  oseltamivir (TAMIFLU) 75 MG capsule Take 1 capsule (75 mg total) by mouth every 12 (twelve) hours. 01/24/23  Yes Brimage, Vondra, DO  sertraline (ZOLOFT) 25 MG tablet Take 25 mg by mouth daily. 04/18/21  Yes [provider]  etonogestrel (NEXPLANON) 68 MG IMPL implant 1 each by Subdermal route once.    [provider]  fluticasone (FLONASE) 50 MCG/ACT nasal spray Place 2 sprays into both nostrils daily. 10/07/21   Rushie Chestnut, PA-C  escitalopram (LEXAPRO) 5 MG tablet Take 1 tablet (5 mg total) by mouth daily. For mood 03/01/19 12/25/19  Aldean Baker, NP  traZODone (DESYREL) 50 MG tablet Take 1 tablet (50 mg total) by mouth at bedtime and may repeat dose one time if needed. 02/28/19 12/25/19  Aldean Baker, NP    Family History Family History  Problem Relation Age of Onset   Restless legs syndrome Mother    Epilepsy Father     Social History Social History   Tobacco  Use   Smoking status: Never   Smokeless tobacco: Never  Vaping Use   Vaping status: Every Day   Substances: Nicotine, Flavoring  Substance Use Topics   Alcohol use: Yes   Drug use: Yes    Frequency: 14.0 times per week    Types: Marijuana    Comment: 2 blunts twice a day     Allergies   Patient has no known allergies.   Review of Systems Review of Systems  Constitutional:  Negative for fever.  Gastrointestinal:  Negative for abdominal pain.  Genitourinary:  Positive for menstrual problem. Negative for dysuria, vaginal bleeding and vaginal discharge.  Musculoskeletal:  Negative  for back pain.  All other systems reviewed and are negative.    Physical Exam Triage Vital Signs ED Triage Vitals  Encounter Vitals Group     BP 08/01/23 1929 136/85     Systolic BP Percentile --      Diastolic BP Percentile --      Pulse Rate 08/01/23 1929 76     Resp 08/01/23 1929 16     Temp 08/01/23 1929 98.9 F (37.2 C)     Temp Source 08/01/23 1929 Oral     SpO2 08/01/23 1929 98 %     Weight 08/01/23 1928 150 lb (68 kg)     Height 08/01/23 1928 5\' 8"  (1.727 m)     Head Circumference --      Peak Flow --      Pain Score --      Pain Loc --      Pain Education --      Exclude from Growth Chart --    No data found.  Updated Vital Signs BP 136/85 (BP Location: Left Arm)   Pulse 76   Temp 98.9 F (37.2 C) (Oral)   Resp 16   Ht 5\' 8"  (1.727 m)   Wt 150 lb (68 kg)   LMP 07/01/2023 (Approximate)   SpO2 98%   BMI 22.81 kg/m   Visual Acuity Right Eye Distance:   Left Eye Distance:   Bilateral Distance:    Right Eye Near:   Left Eye Near:    Bilateral Near:     Physical Exam Vitals and nursing note reviewed.  Constitutional:      General: She is not in acute distress.    Appearance: She is well-developed and well-groomed.  HENT:     Head: Normocephalic and atraumatic.  Eyes:     Conjunctiva/sclera: Conjunctivae normal.  Cardiovascular:     Rate and Rhythm: Normal rate and regular rhythm.     Pulses: Normal pulses.     Heart sounds: Normal heart sounds. No murmur heard. Pulmonary:     Effort: Pulmonary effort is normal. No respiratory distress.     Breath sounds: Normal breath sounds and air entry.  Abdominal:     Palpations: Abdomen is soft.     Tenderness: There is no abdominal tenderness.  Musculoskeletal:        General: No swelling.     Cervical back: Neck supple.  Skin:    General: Skin is warm and dry.     Capillary Refill: Capillary refill takes less than 2 seconds.  Neurological:     General: No focal deficit present.     Mental Status:  She is alert and oriented to person, place, and time.     GCS: GCS eye subscore is 4. GCS verbal subscore is 5. GCS motor subscore is 6.  Psychiatric:  Attention and Perception: Attention normal.        Mood and Affect: Mood normal.        Speech: Speech normal.        Behavior: Behavior normal. Behavior is cooperative.      UC Treatments / Results  Labs (all labs ordered are listed, but only abnormal results are displayed) Labs Reviewed  URINALYSIS, W/ REFLEX TO CULTURE (INFECTION SUSPECTED) - Abnormal; Notable for the following components:      Result Value   APPearance CLOUDY (*)    Specific Gravity, Urine >1.030 (*)    Bilirubin Urine SMALL (*)    Protein, ur 30 (*)    Bacteria, UA FEW (*)    All other components within normal limits  PREGNANCY, URINE    EKG   Radiology No results found.  Procedures Procedures (including critical care time)  Medications Ordered in UC Medications - No data to display  Initial Impression / Assessment and Plan / UC Course  I have reviewed the triage vital signs and the nursing notes.  Pertinent labs & imaging results that were available during my care of the patient were reviewed by me and considered in my medical decision making (see chart for details).  Clinical Course as of 08/01/23 1959  Wed Aug 01, 2023  1949 Contaminated UA with 21-50 squamous cells [JD]    Clinical Course User Index [JD] Wallace Cogliano, Para March, NP    Ddx: Missed period, proteinuria Final Clinical Impressions(s) / UC Diagnoses   Final diagnoses:  Urine pregnancy test negative  Irregular periods/menstrual cycles  Persistent proteinuria     Discharge Instructions      Your pregnancy test is negative.   Your urinalysis is negative for uti, you have mild protein in your urine(chronic). Please follow up with PCP.  Rest,push fluids, follow up with PCP/GYN for further evaluation of irregular periods.      ED Prescriptions   None    PDMP  not reviewed this encounter.   Clancy Gourd, NP 08/01/23 [phone]

## 2023-09-08 ENCOUNTER — Ambulatory Visit
Admission: EM | Admit: 2023-09-08 | Discharge: 2023-09-08 | Disposition: A | Discharge status: Home / Self Care | Payer: Medicaid Other | Attending: Emergency Medicine | Admitting: Emergency Medicine

## 2023-09-08 DIAGNOSIS — Z3201 Encounter for pregnancy test, result positive: Secondary | ICD-10-CM | POA: Insufficient documentation

## 2023-09-08 LAB — PREGNANCY, URINE: Preg Test, Ur: POSITIVE — AB

## 2023-09-08 LAB — HCG, QUANTITATIVE, PREGNANCY: hCG, Beta Chain, Quant, S: 298 m[IU]/mL — ABNORMAL HIGH (ref <5)

## 2023-09-08 NOTE — ED Provider Notes (Signed)
[facility]    CSN: [id number] Arrival date & time: 09/08/23  0844      History   Chief Complaint Chief Complaint  Patient presents with   Possible Pregnancy    HPI Christine May is a 22 y.o. female.   HPI  22 year old female with a past medical history significant for Duane syndrome, bipolar 1 disorder, anxiety disorder of adolescent, and ADHD presents requesting confirmatory pregnancy testing.  Her last period was on 08/06/2023 and she has been experiencing breast tenderness and nausea.  She took 3 home pregnancy test that were all positive.  She is requesting a beta-hCG.  Patient reports that she is not taking any medication at this time.  Her Nexplanon has been removed and she is no longer on OCPs.  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   Bipolar 1 disorder (HCC)    Depression 2014   Duanes syndrome    Headache     Patient Active Problem List   Diagnosis Date Noted   Urine pregnancy test negative 08/01/2023   Irregular periods/menstrual cycles 08/01/2023   Persistent proteinuria 08/01/2023   Possible pregnancy, not yet confirmed 04/26/2022   MDD (major depressive disorder), severe (HCC) 02/26/2019   Deliberate self-cutting    Severe single current episode of major depressive disorder, without psychotic features (HCC)    Severe episode of recurrent major depressive disorder, without psychotic features (HCC)    Nausea with vomiting    Cephalalgia    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      Home Medications    Prior to Admission medications   Medication Sig Start Date End Date Taking? Authorizing Provider  buPROPion (WELLBUTRIN) 75 MG  tablet Take 75 mg by mouth every morning. 02/21/23   [provider]  ESTARYLLA 0.25-35 MG-MCG tablet Take 1 tablet by mouth daily. 08/19/21   [provider]  etonogestrel (NEXPLANON) 68 MG IMPL implant 1 each by Subdermal route once.    [provider]  fluticasone (FLONASE) 50 MCG/ACT nasal spray Place 2 sprays into both nostrils daily. 10/07/21   Rushie Chestnut, PA-C  hydrOXYzine (ATARAX) 10 MG tablet Take 10 mg by mouth 3 (three) times daily as needed.    [provider]  nitrofurantoin, macrocrystal-monohydrate, (MACROBID) 100 MG capsule Take 1 capsule (100 mg total) by mouth 2 (two) times daily. 03/16/23   Eusebio Friendly B, PA-C  ondansetron (ZOFRAN-ODT) 8 MG disintegrating tablet Take 1 tablet (8 mg total) by mouth every 8 (eight) hours as needed. 01/24/23   Katha Cabal, DO  oseltamivir (TAMIFLU) 75 MG capsule Take 1 capsule (75 mg total) by mouth every 12 (twelve) hours. 01/24/23   Katha Cabal, DO  sertraline (ZOLOFT) 25 MG tablet Take 25 mg by mouth daily. 04/18/21   [provider]  escitalopram (LEXAPRO) 5 MG tablet Take 1 tablet (5 mg total) by mouth daily. For mood 03/01/19 12/25/19  Aldean Baker, NP  traZODone (DESYREL) 50 MG tablet Take 1 tablet (50 mg total) by mouth at bedtime and may repeat dose one time if needed. 02/28/19 12/25/19  Aldean Baker, NP  Family History Family History  Problem Relation Age of Onset   Restless legs syndrome Mother    Epilepsy Father     Social History Social History   Tobacco Use   Smoking status: Never   Smokeless tobacco: Never  Vaping Use   Vaping status: Every Day   Substances: Nicotine, Flavoring  Substance Use Topics   Alcohol use: Yes   Drug use: Yes    Frequency: 14.0 times per week    Types: Marijuana    Comment: 2 blunts twice a day     Allergies   Patient has no known allergies.   Review of Systems Review of Systems  Constitutional:        Breast tenderness.   Gastrointestinal:  Positive for nausea.     Physical Exam Triage Vital Signs ED Triage Vitals  Encounter Vitals Group     BP      Systolic BP Percentile      Diastolic BP Percentile      Pulse      Resp      Temp      Temp src      SpO2      Weight      Height      Head Circumference      Peak Flow      Pain Score      Pain Loc      Pain Education      Exclude from Growth Chart    No data found.  Updated Vital Signs BP 128/83 (BP Location: Right Arm)   Pulse 84   Temp 98.6 F (37 C) (Oral)   Resp 18   Wt 180 lb (81.6 kg)   LMP 08/06/2023   SpO2 97%   BMI 27.37 kg/m   Visual Acuity Right Eye Distance:   Left Eye Distance:   Bilateral Distance:    Right Eye Near:   Left Eye Near:    Bilateral Near:     Physical Exam Vitals and nursing note reviewed.  Constitutional:      Appearance: Normal appearance. She is not ill-appearing.  HENT:     Head: Normocephalic and atraumatic.  Skin:    General: Skin is warm and dry.     Capillary Refill: Capillary refill takes less than 2 seconds.  Neurological:     General: No focal deficit present.     Mental Status: She is alert and oriented to person, place, and time.      UC Treatments / Results  Labs (all labs ordered are listed, but only abnormal results are displayed) Labs Reviewed  PREGNANCY, URINE - Abnormal; Notable for the following components:      Result Value   Preg Test, Ur POSITIVE (*)    All other components within normal limits  HCG, QUANTITATIVE, PREGNANCY - Abnormal; Notable for the following components:   hCG, Beta Chain, Quant, S 298 (*)    All other components within normal limits    EKG   Radiology No results found.  Procedures Procedures (including critical care time)  Medications Ordered in UC Medications - No data to display  Initial Impression / Assessment and Plan / UC Course  I have reviewed the triage vital signs and the nursing notes.  Pertinent labs & imaging  results that were available during my care of the patient were reviewed by me and considered in my medical decision making (see chart for details).  Clinical Course as of 09/08/23 [phone]  Sat Sep 08, 2023  0951 HCG, Beta Chain, Quant, Kathie Rhodes(!): 298 [JR]    Clinical Course User Index [JR] Becky Augusta, NP  Patient is a nontoxic-appearing 22 year old female presenting for confirmatory pregnancy testing after having 3 positive home pregnancy tests.  She is not on any contraceptives and her last period was on 08/06/2023.  She does endorse breast tenderness and nausea but no vomiting.  She does have an extensive psychiatric history but is not taking any medications at this time.  I will order a urine pregnancy test as well as beta-hCG.  Urine pregnancy test is positive.  Beta-hCG is 298.  Given patient's beta hCG she is similar between 2 and [redacted] weeks gestation.  She does not currently have an OB/GYN so I will refer her to [facility].  I have encouraged her to start a prenatal vitamin as well as folic acid if she is not currently taking those.  She doctorly taking any medication.  I will give her a list of over-the-counter medications that she can use during pregnancy.   Final Clinical Impressions(s) / UC Diagnoses   Final diagnoses:  Positive pregnancy test     Discharge Instructions      You blood and urine pregnancy test are both positive today.  Your blood pregnancy test please summer between 2 and 3 weeks of gestation.  I [facility].  They will contact you for an appointment.  Begin taking a prenatal vitamin, as well as folic acid, if you have not begin doing so.  I have given you a list of over-the-counter medications that are safe to use in pregnancy.  If you develop any new or worsening symptoms please return for reevaluation.     ED Prescriptions   None    PDMP not reviewed this encounter.   Becky Augusta, NP 09/08/23 [phone]

## 2023-09-08 NOTE — ED Triage Notes (Signed)
Patient states that she took home preg test that are positive. Wants blood test to see how far along she is. LMP 8/19.

## 2023-09-08 NOTE — Discharge Instructions (Addendum)
You blood and urine pregnancy test are both positive today.  Your blood pregnancy test please summer between 2 and 3 weeks of gestation.  I have made a referral to [facility] to establish your prenatal care.  They will contact you for an appointment.  Begin taking a prenatal vitamin, as well as folic acid, if you have not begin doing so.  I have given you a list of over-the-counter medications that are safe to use in pregnancy.  If you develop any new or worsening symptoms please return for reevaluation.

## 2023-09-12 ENCOUNTER — Telehealth: Payer: Self-pay

## 2023-09-12 ENCOUNTER — Ambulatory Visit
Admission: EM | Admit: 2023-09-12 | Discharge: 2023-09-12 | Disposition: A | Discharge status: Home / Self Care | Payer: Medicaid Other | Attending: Emergency Medicine | Admitting: Emergency Medicine

## 2023-09-12 DIAGNOSIS — N760 Acute vaginitis: Secondary | ICD-10-CM | POA: Diagnosis present

## 2023-09-12 DIAGNOSIS — B9689 Other specified bacterial agents as the cause of diseases classified elsewhere: Secondary | ICD-10-CM | POA: Diagnosis present

## 2023-09-12 LAB — URINALYSIS, W/ REFLEX TO CULTURE (INFECTION SUSPECTED)
Glucose, UA: NEGATIVE mg/dL
Ketones, ur: NEGATIVE mg/dL
Leukocytes,Ua: NEGATIVE
Nitrite: NEGATIVE
Protein, ur: 100 mg/dL — AB
RBC / HPF: >50 RBC/hpf (ref 0–5)
Specific Gravity, Urine: >1.03 — ABNORMAL HIGH (ref 1.005–1.030)
Squamous Epithelial / HPF: >50 /HPF (ref 0–5)
pH: 7 (ref 5.0–8.0)

## 2023-09-12 LAB — WET PREP, GENITAL
Sperm: NONE SEEN
Trich, Wet Prep: NONE SEEN
WBC, Wet Prep HPF POC: >10 — AB (ref <10)
Yeast Wet Prep HPF POC: NONE SEEN

## 2023-09-12 MED ORDER — METRONIDAZOLE 0.75 % VA GEL: 1.0000 | Freq: Every day | VAGINAL | 0 refills | Status: DC

## 2023-09-12 NOTE — ED Provider Notes (Signed)
[facility]    CSN: [id number] Arrival date & time: 09/12/23  1459      History   Chief Complaint Chief Complaint  Patient presents with   Vaginal Bleeding    HPI Christine May is a 22 y.o. female.   HPI  22 year old female who is currently [redacted] weeks pregnant presents for evaluation of 3 days worth of vaginal spotting.  She reports that she began having spotting after she and her partner had intercourse.  She is not having any flow or clot passage and is either bright red or pink only when she wipes.  She is having left inguinal pain with urination and sometimes after urination but no fever.  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   Bipolar 1 disorder (HCC)    Depression 2014   Duanes syndrome    Headache     Patient Active Problem List   Diagnosis Date Noted   Urine pregnancy test negative 08/01/2023   Irregular periods/menstrual cycles 08/01/2023   Persistent proteinuria 08/01/2023   Possible pregnancy, not yet confirmed 04/26/2022   MDD (major depressive disorder), severe (HCC) 02/26/2019   Deliberate self-cutting    Severe single current episode of major depressive disorder, without psychotic features (HCC)    Severe episode of recurrent major depressive disorder, without psychotic features (HCC)    Nausea with vomiting    Cephalalgia    Anxiety disorder of adolescence 11/07/2015   Major depression 11/06/2015    Past Surgical History:  Procedure Laterality Date   EYE SURGERY     TYMPANOSTOMY TUBE PLACEMENT     WISDOM TOOTH EXTRACTION      OB History     Gravida  1   Para      Term      Preterm      AB      Living         SAB      IAB      Ectopic      Multiple      Live Births               Home Medications    Prior to Admission medications   Medication Sig Start Date End Date Taking? Authorizing Provider   metroNIDAZOLE (METROGEL) 0.75 % vaginal gel Place 1 Applicatorful vaginally at bedtime. 09/12/23  Yes Becky Augusta, NP  buPROPion (WELLBUTRIN) 75 MG tablet Take 75 mg by mouth every morning. 02/21/23   [provider]  ESTARYLLA 0.25-35 MG-MCG tablet Take 1 tablet by mouth daily. 08/19/21   [provider]  etonogestrel (NEXPLANON) 68 MG IMPL implant 1 each by Subdermal route once.    [provider]  fluticasone (FLONASE) 50 MCG/ACT nasal spray Place 2 sprays into both nostrils daily. 10/07/21   Rushie Chestnut, PA-C  hydrOXYzine (ATARAX) 10 MG tablet Take 10 mg by mouth 3 (three) times daily as needed.    [provider]  nitrofurantoin, macrocrystal-monohydrate, (MACROBID) 100 MG capsule Take 1 capsule (100 mg total) by mouth 2 (two) times daily. 03/16/23   Eusebio Friendly B, PA-C  ondansetron (ZOFRAN-ODT) 8 MG disintegrating tablet Take 1 tablet (8 mg total) by mouth every 8 (eight) hours as needed. 01/24/23   Katha Cabal, DO  oseltamivir (TAMIFLU) 75 MG capsule Take 1 capsule (75 mg total) by mouth every 12 (twelve) hours. 01/24/23   Katha Cabal, DO  sertraline (ZOLOFT) 25 MG tablet Take 25 mg by mouth daily. 04/18/21   [provider]  escitalopram (LEXAPRO) 5 MG tablet Take 1 tablet (5 mg total) by mouth daily. For mood 03/01/19 12/25/19  Aldean Baker, NP  traZODone (DESYREL) 50 MG tablet Take 1 tablet (50 mg total) by mouth at bedtime and may repeat dose one time if needed. 02/28/19 12/25/19  Aldean Baker, NP    Family History Family History  Problem Relation Age of Onset   Restless legs syndrome Mother    Epilepsy Father     Social History Social History   Tobacco Use   Smoking status: Never   Smokeless tobacco: Never  Vaping Use   Vaping status: Every Day   Substances: Nicotine, Flavoring  Substance Use Topics   Alcohol use: Yes   Drug use: Yes    Frequency: 14.0 times per week    Types: Marijuana    Comment: 2 blunts twice a day      Allergies   Patient has no known allergies.   Review of Systems Review of Systems  Constitutional:  Negative for fever.  Gastrointestinal:  Positive for abdominal pain.  Genitourinary:  Positive for dysuria and vaginal bleeding. Negative for frequency and urgency.     Physical Exam Triage Vital Signs ED Triage Vitals  Encounter Vitals Group     BP 09/12/23 1514 117/79     Systolic BP Percentile --      Diastolic BP Percentile --      Pulse Rate 09/12/23 1514 67     Resp 09/12/23 1514 18     Temp 09/12/23 1514 98.4 F (36.9 C)     Temp Source 09/12/23 1514 Oral     SpO2 09/12/23 1514 98 %     Weight 09/12/23 1513 178 lb 9.2 oz (81 kg)     Height --      Head Circumference --      Peak Flow --      Pain Score 09/12/23 1513 3     Pain Loc --      Pain Education --      Exclude from Growth Chart --    No data found.  Updated Vital Signs BP 117/79 (BP Location: Right Arm)   Pulse 67   Temp 98.4 F (36.9 C) (Oral)   Resp 18   Wt 178 lb 9.2 oz (81 kg)   LMP 08/06/2023   SpO2 98%   BMI 27.15 kg/m   Visual Acuity Right Eye Distance:   Left Eye Distance:   Bilateral Distance:    Right Eye Near:   Left Eye Near:    Bilateral Near:     Physical Exam Vitals and nursing note reviewed.  Constitutional:      Appearance: Normal appearance. She is not ill-appearing.  HENT:     Head: Normocephalic and atraumatic.  Abdominal:     General: Abdomen is flat.     Palpations: Abdomen is soft.     Tenderness: There is abdominal tenderness. There is no guarding or rebound.     Comments: Mild pain in the left inguinal/adnexal region without guarding or rebound.  Skin:    General: Skin is warm and dry.     Capillary Refill: Capillary refill takes less than 2 seconds.     Findings: No rash.  Neurological:     General: No focal deficit present.     Mental Status: She is alert and oriented to person, place,  and time.      UC Treatments / Results  Labs (all  labs ordered are listed, but only abnormal results are displayed) Labs Reviewed  WET PREP, GENITAL - Abnormal; Notable for the following components:      Result Value   Clue Cells Wet Prep HPF POC PRESENT (*)    WBC, Wet Prep HPF POC >10 (*)    All other components within normal limits  URINALYSIS, W/ REFLEX TO CULTURE (INFECTION SUSPECTED) - Abnormal; Notable for the following components:   Color, Urine RED (*)    APPearance HAZY (*)    Specific Gravity, Urine >1.030 (*)    Hgb urine dipstick LARGE (*)    Bilirubin Urine SMALL (*)    Protein, ur 100 (*)    Bacteria, UA MANY (*)    All other components within normal limits    EKG   Radiology No results found.  Procedures Procedures (including critical care time)  Medications Ordered in UC Medications - No data to display  Initial Impression / Assessment and Plan / UC Course  I have reviewed the triage vital signs and the nursing notes.  Pertinent labs & imaging results that were available during my care of the patient were reviewed by me and considered in my medical decision making (see chart for details).   Patient is a nontoxic-appearing 22 year old female presenting for evaluation of vaginal spotting for 3 days that began after having intercourse.  She is currently [redacted] weeks pregnant.  The patient reports that on the first day she saw what medical and 2 small clots but has not had any clot passage or a flow vaginal bleeding since.  She is not having to use a panty liner or pad.  She does have some left inguinal/adnexal tenderness that is present sometimes with urination and sometimes after urination but no urgency or frequency.  Also no fevers.  It is unclear if this bleeding is actually coming from the vaginal vault or from the patient's bladder.  I will order urinalysis to evaluate for the presence of UTI.  I did advise her that we will not repeat her beta-hCG today as she is [redacted] weeks along and if she is potentially miscarrying  there is nothing that could be done to save the fetus at this time.  Patient does have a referral to [facility] in the system from her last visit.  Urinalysis was red in color with hazy appearance of high specific gravity.  Large hemoglobin with small bilirubin and 100 protein.  Negative for leukocyte esterase or nitrites.  Reflex microscopy shows greater than 50 squamous epithelials with 0-5 WBCs.  Also greater than 50 RBCs and many bacteria.  I will order a vaginal wet prep.  Wet prep is positive for clue cells.  I will discharge patient home with a diagnosis of bacterial vaginosis and start her on MetroGel 1 applicator at night intravaginally for 7 days.   Final Clinical Impressions(s) / UC Diagnoses   Final diagnoses:  BV (bacterial vaginosis)     Discharge Instructions      Instill 1 applicator of MetroGel intravaginally at bedtime for 7 days for treatment of bacterial vaginosis.  Avoid alcohol while on the metronidazole as taken together will cause of vomiting.  Bacterial vaginosis is often caused by a imbalance of bacteria in your vaginal vault.  This is sometimes a result of using tampons or hormonal fluctuations during her menstrual cycle.  You if your symptoms are recurrent you can  try using a boric acid suppository twice weekly to help maintain the acid-base balance in your vagina vault which could prevent further infection.  You can also try vaginal probiotics to help return normal bacterial balance.      ED Prescriptions     Medication Sig Dispense Auth. Provider   metroNIDAZOLE (METROGEL) 0.75 % vaginal gel Place 1 Applicatorful vaginally at bedtime. 70 g Becky Augusta, NP      PDMP not reviewed this encounter.   Becky Augusta, NP 09/12/23 1626

## 2023-09-12 NOTE — Telephone Encounter (Signed)
Pt calling; is 5w and has had implantation bleeding for 3d now; should she worry?  [phone]  Pt endorses IC 24-48hrs before this bleeding started; adv spotting is probably from that.  Pt states it is just when she wipes and it''s pale red to clear.  Adv  this does happen but I won't say it's normal.  Adv pt to monitor the spotting and if it becomes like a period to be seen.  Pt reassured.

## 2023-09-12 NOTE — ED Triage Notes (Signed)
Patient is pregnant and having bleeding. X 3 days. Cramping not period pains.

## 2023-09-12 NOTE — Discharge Instructions (Addendum)
Instill 1 applicator of MetroGel intravaginally at bedtime for 7 days for treatment of bacterial vaginosis.  Avoid alcohol while on the metronidazole as taken together will cause of vomiting.  Bacterial vaginosis is often caused by a imbalance of bacteria in your vaginal vault.  This is sometimes a result of using tampons or hormonal fluctuations during her menstrual cycle.  You if your symptoms are recurrent you can try using a boric acid suppository twice weekly to help maintain the acid-base balance in your vagina vault which could prevent further infection.  You can also try vaginal probiotics to help return normal bacterial balance.

## 2023-09-28 ENCOUNTER — Ambulatory Visit: Payer: Medicaid Other

## 2023-10-04 ENCOUNTER — Encounter: Payer: Self-pay | Admitting: Obstetrics

## 2023-10-04 ENCOUNTER — Ambulatory Visit: Payer: Medicaid Other | Admitting: Obstetrics

## 2023-10-04 VITALS — BP 128/82 | HR 92 | Ht 68.0 in | Wt 186.0 lb

## 2023-10-04 DIAGNOSIS — O039 Complete or unspecified spontaneous abortion without complication: Secondary | ICD-10-CM | POA: Diagnosis not present

## 2023-10-04 NOTE — Progress Notes (Signed)
    GYNECOLOGY PROGRESS NOTE  Subjective:    Patient ID: Christine May, female    DOB: [birth year], 22 y.o.   MRN: [id number]  HPI  Patient is a 22 y.o. [id number] female who presents with her partner for ED follow up for a miscarriage. Was seen on 10/[facility] at [facility] ED where TVUS showed no IUP and HCG was 20. Pt states her bleeding has stopped since a few days ago and she is not cramping or having pelvic/abd pain. She did desire pregnancy so is very sad at this time, but otherwise denies any other symptoms. Periods are typically regular Q30days and last 4-5 days. Was not on contraception and declines today.   The following portions of the patient's history were reviewed and updated as appropriate: allergies, current medications, past family history, past medical history, past social history, past surgical history, and problem list.  Review of Systems Pertinent items are noted in HPI.   Objective:   Blood pressure 128/82, pulse 92, height 5\' 8"  (1.727 m), weight 186 lb (84.4 kg), last menstrual period 08/06/2023. Body mass index is 28.28 kg/m.  General appearance: alert and cooperative Abdomen: soft, non-tender; bowel sounds normal; no masses,  no organomegaly Pelvic: deferred Extremities: extremities normal, atraumatic, no cyanosis or edema Neurologic: Grossly normal  hCG 09/08/23: 298 hCG 09/28/23: 19.4   10/04/23 US OB Transvaginal  Final Result  Pregnancy of unknown location. Differential considerations include early intrauterine pregnancy, early pregnancy loss and ectopic pregnancy.   Assessment/Plan:   1. Complete miscarriage    22yo G1 now P0010 s/p a complete miscarriage approximately on 09/28/23, confirmed by decreasing Hcg from 298 to 20, with TVUS showing no IUP, at [facility] ED. Here for ED follow up, bleeding has ceased and pt without any further symptoms. -Recheck Hcg today and trend to <5 -No need for another [place], as 10/04/23 result showed no IUP or retained POCs -Counseled on  mental health, reach out if experiencing concerning symptoms -Pt desiring future pregnancy, start prenatal vitamins today, stop vaping -Follow up 1-2 mos for WWE, sooner prn.    Julieanne Manson, DO [facility]

## 2023-10-05 LAB — BETA HCG QUANT (REF LAB): hCG Quant: 2 m[IU]/mL

## 2023-12-19 NOTE — L&D Delivery Note (Signed)
"     Delivery Note   Christine May is a 23 y.o. [id number] at [redacted]w[redacted]d Estimated Date of Delivery: 12/17/24  PRE-OPERATIVE DIAGNOSIS:  1) [redacted]w[redacted]d pregnancy.    POST-OPERATIVE DIAGNOSIS:  1) [redacted]w[redacted]d pregnancy s/p Vaginal, Spontaneous 2) 2nd degree laceration, repaired  Delivery Type: Vaginal, Spontaneous   Delivery Anesthesia: Epidural  Labor Complications: none    ESTIMATED BLOOD LOSS: 250 ml    FINDINGS:   1) female infant, Greyson, Apgar scores of 8   at 1 minute and 9   at 5 minutes and a birthweight of 3860 g.     SPECIMENS:   PLACENTA:   Appearance: Intact   Removal: Spontaneous     Disposition:    CORD BLOOD: Collected/Not Indicated  DISPOSITION:  Infant left in stable condition in the delivery room, with L&D personnel and mother,  NARRATIVE SUMMARY: Labor course:  [name] is a [id number] at [redacted]w[redacted]d who presented to Labor & Delivery for labor. Her initial cervical exam was 4/80/-2. Labor proceeded spontaneously, and she was found to be completely dilated at 1453. With excellent maternal pushing effort, she birthed a viable female infant at 47. Head delivered  LOA and rotated to LOT. There was not a nuchal cord. The shoulders were birthed without difficulty. The infant was placed skin-to-skin with Jereline. The cord was doubly clamped and cut by the father when pulsations ceased. Pitocin was infused per protocol for AMTSL. The placenta delivered spontaneously and was noted to be intact with a 3VC. A perineal and vaginal examination was performed. Episiotomy/Lacerations: 2nd degree;Labial;Vaginal Brisk bleeding was noted from the laceration, and TXA was administered as the repair was started. Lacerations were repaired with Vicryl suture using epidural anesthesia. Naria tolerated this well. Mother and baby were left in stable condition.   [name] Canny, CNM 12/17/2024 5:07 PM  "

## 2024-05-09 ENCOUNTER — Telehealth (INDEPENDENT_AMBULATORY_CARE_PROVIDER_SITE_OTHER)

## 2024-05-09 DIAGNOSIS — Z9889 Other specified postprocedural states: Secondary | ICD-10-CM | POA: Insufficient documentation

## 2024-05-09 DIAGNOSIS — Z3A01 Less than 8 weeks gestation of pregnancy: Secondary | ICD-10-CM

## 2024-05-09 DIAGNOSIS — Z3480 Encounter for supervision of other normal pregnancy, unspecified trimester: Secondary | ICD-10-CM | POA: Insufficient documentation

## 2024-05-09 DIAGNOSIS — O09299 Supervision of pregnancy with other poor reproductive or obstetric history, unspecified trimester: Secondary | ICD-10-CM | POA: Insufficient documentation

## 2024-05-09 NOTE — Progress Notes (Signed)
 New OB Intake  I connected with  Christine May on 05/09/24 at  2:15 PM EDT by MyChart Video Visit and verified that I am speaking with the correct person using two identifiers. Nurse is located at [facility] and pt is located at home.  I discussed the limitations, risks, security and privacy concerns of performing an evaluation and management service by telephone and the availability of in person appointments. I also discussed with the patient that there May be a patient responsible charge related to this service. The patient expressed understanding and agreed to proceed.  I explained I am completing New OB Intake today. We discussed her EDD of 12/17/24 that is based on LMP of 03/12/24. Pt is G3/P0. I reviewed her allergies, medications, Medical/Surgical/OB history, and appropriate screenings. There are cats in the home: no. If yes: Indoor. Based on history, this is a/an pregnancy uncomplicated . Her obstetrical history is significant for N/A.  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  Anatomy US  Explained Anatomy US  will be scheduled around [redacted] weeks gestational age.  Labs Discussed genetic screening with patient. Patient desires genetic testing to be drawn at new OB visit. Discussed possible labs to be drawn at  new OB appointment.  COVID Vaccine Patient has not had COVID vaccine.   Social Determinants of Health Food Insecurity: denies food insecurity  Transportation: Patient denies transportation needs. Childcare: Discussed no children allowed at ultrasound appointments.   First visit review I reviewed new OB appt with pt. I explained she will have blood work and pap smear/pelvic exam if indicated. Explained pt will be seen by Quince Bryant, CNM at first visit; encounter routed to appropriate provider.   Christine May, CMA 05/09/2024  2:46 PM

## 2024-05-09 NOTE — Patient Instructions (Signed)
 Morning Sickness Morning sickness is when you throw up or feel like you may throw up during pregnancy. This condition often occurs in the morning, but it can also occur at any time of day. Morning sickness is most common during the first three months of pregnancy, but it can go on throughout the pregnancy. Morning sickness is usually harmless. But if you throw up all the time, you should see your health care provider. You may also hear this condition called nausea and vomiting of pregnancy. What are the causes? The cause of morning sickness is not known. It may be linked to changes in hormones during pregnancy. What increases the risk? You're more likely to have morning sickness if: You had morning sickness in another pregnancy. You're pregnant with more than one baby, such as twins. You had morning sickness in other pregnancies. You have had motion sickness before you were pregnant. You have had bad headaches or migraines before you were pregnant. What are the signs or symptoms? Symptoms of morning sickness include: Feeling like you may throw up. Throwing up. How is this diagnosed? Morning sickness is diagnosed based on your symptoms. How is this treated? Treatment is usually not needed for morning sickness. You may only need to change what you eat. In some cases, your provider may give you: Vitamin B6 supplements. Medicines to prevent throwing up. Ginger. Follow these instructions at home: Medicines Take your medicines only as told by your provider. Do not use any prescription, over-the-counter, or herbal medicines for morning sickness without first talking with your provider. Take prenatal vitamins. These can stop or lessen the symptoms of morning sickness. If you feel like you may throw up after taking prenatal vitamins, take them at night or with a snack. Eating and drinking     Eat dry toast or crackers before getting out of bed. Eat 5 or 6 small meals a day. Try ginger ale  made with real ginger, ginger tea, or ginger candies. Drink fluids throughout the day. Eat protein foods when you need a snack. Nuts, yogurt, and cheese are good choices. Eat dry and bland foods like rice or baked potatoes. Foods that are high in carbohydrates are often helpful. Have someone cook for you if the smell of food makes you want to throw up. Foods to avoid Greasy foods. Fatty foods. Spicy foods. General instructions Try to avoid smells that make you feel sick. Use an air purifier to keep the air in your house free of smells. Try using an acupressure wristband. This is a wristband that's used to treat motion sickness. Try acupuncture. In this treatment, a provider puts thin needles into certain areas of your body to make you feel better. Brush your teeth after throwing up or rinse with a mix of baking soda and water. The acid in throw-up can hurt your teeth. Contact a health care provider if: Your symptoms do not get better. You feel dizzy or light-headed. You're losing weight. Get help right away if: The feeling that you may throw up will not go away, or you can't stop throwing up. You faint. You have very bad pain in your belly. This information is not intended to replace advice given to you by your health care provider. Make sure you discuss any questions you have with your health care provider. Document Revised: 09/06/2023 Document Reviewed: 03/15/2023 Elsevier Patient Education  2024 [employer].  First Trimester of Pregnancy  The first trimester of pregnancy starts on the first day of your last  monthly period until the end of week 13. This is months 1 through 3 of pregnancy. A week after a sperm fertilizes an egg, the egg will implant into the wall of the uterus and begin to develop into a baby. Body changes during your first trimester Your body goes through many changes during pregnancy. The changes usually return to normal after your baby is born. Physical  changes Your breasts may grow larger and may hurt. The area around your nipples may get darker. Your periods will stop. Your hair and nails may grow faster. You may pee more often. Health changes You may tire easily. Your gums may bleed and may be sensitive when you brush and floss. You may not feel hungry. You may have heartburn. You may throw up or feel like you may throw up. You may want to eat some foods, but not others. You may have headaches. You may have trouble pooping (constipation). Other changes Your emotions may change from day to day. You may have more dreams. Follow these instructions at home: Medicines Talk to your health care provider if you're taking medicines. Ask if the medicines are safe to take during pregnancy. Your provider may change the medicines that you take. Do not take any medicines unless told to by your provider. Take a prenatal vitamin that has at least 600 micrograms (mcg) of folic acid. Do not use herbal medicines, illegal substances, or medicines that are not approved by your provider. Eating and drinking While you're pregnant your body needs extra food for your growing baby. Talk with your provider about what to eat while pregnant. Activity Most women are able to exercise during pregnancy. Exercises may need to change as your pregnancy goes on. Talk to your provider about your activities and exercise routines. Relieving pain and discomfort Wear a good, supportive bra if your breasts hurt. Rest with your legs raised if you have leg cramps or low back pain. Safety Wear your seatbelt at all times when you're in a car. Talk to your provider if someone hits you, hurts you, or yells at you. Talk with your provider if you're feeling sad or have thoughts of hurting yourself. Lifestyle Certain things can be harmful while you're pregnant. Follow these rules: Do not use hot tubs, steam rooms, or saunas. Do not douche. Do not use tampons or scented  pads. Do not drink alcohol,smoke, vape, or use products with nicotine or tobacco in them. If you need help quitting, talk with your provider. Avoid cat litter boxes and soil used by cats. These things carry germs that can cause harm to your pregnancy and your baby. General instructions Keep all follow-up visits. It helps you and your unborn baby stay as healthy as possible. Write down your questions. Take them to your visits. Your provider will: Talk with you about your overall health. Give you advice or refer you to specialists who can help with different needs, including: Prenatal education classes. Mental health and counseling. Foods and healthy eating. Ask for help if you need help with food. Call your dentist and ask to be seen. Brush your teeth with a soft toothbrush. Floss gently. Where to find more information American Pregnancy Association: americanpregnancy.org [employer]ologists: acog.org Office on [employer] Health: [employer] Contact a health care provider if: You feel dizzy, faint, or have a fever. You vomit or have watery poop (diarrhea) for 2 days or more. You have abnormal discharge or bleeding from your vagina. You have pain when you  pee or your pee smells bad. You have cramps, pain, or pressure in your belly area. Get help right away if: You have trouble breathing or chest pain. You have any kind of injury, such as from a fall or a car crash. These symptoms may be an emergency. Get help right away. Call 911. Do not wait to see if the symptoms will go away. Do not drive yourself to the hospital. This information is not intended to replace advice given to you by your health care provider. Make sure you discuss any questions you have with your health care provider. Document Revised: 09/06/2023 Document Reviewed: 04/06/2023 Elsevier Patient Education  2024 Elsevier Inc.  Common Medications Safe in  Pregnancy  Acne:      Constipation:  Benzoyl Peroxide     Colace  Clindamycin      Dulcolax Suppository  Topica Erythromycin     Fibercon  Salicylic Acid      Metamucil         Miralax AVOID:        Senakot   Accutane    Cough:  Retin-A       Cough Drops  Tetracycline      Phenergan w/ Codeine if Rx  Minocycline      Robitussin (Plain & DM)  Antibiotics:     Crabs/Lice:  Ceclor       RID  Cephalosporins    AVOID:  E-Mycins      Kwell  Keflex   Macrobid /Macrodantin    Diarrhea:  Penicillin      Kao-Pectate  Zithromax      Imodium AD         PUSH FLUIDS AVOID:       Cipro     Fever:  Tetracycline      Tylenol  (Regular or Extra  Minocycline       Strength)  Levaquin      Extra Strength-Do not          Exceed 8 tabs/24 hrs Caffeine:        200mg /day (equiv. To 1 cup of coffee or  approx. 3 12 oz sodas)         Gas: Cold/Hayfever:       Gas-X  Benadryl       Mylicon  Claritin       Phazyme  **Claritin-D        Chlor-Trimeton    Headaches:  Dimetapp      ASA-Free Excedrin  Drixoral-Non-Drowsy     Cold Compress  Mucinex (Guaifenasin)     Tylenol  (Regular or Extra  Sudafed/Sudafed-12 Hour     Strength)  **Sudafed PE Pseudoephedrine   Tylenol  Cold & Sinus     Vicks Vapor Rub  Zyrtec  **AVOID if Problems With Blood Pressure         Heartburn: Avoid lying down for at least 1 hour after meals  Aciphex      Maalox     Rash:  Milk of Magnesia     Benadryl     Mylanta       1% Hydrocortisone Cream  Pepcid  Pepcid Complete   Sleep Aids:  Prevacid      Ambien   Prilosec       Benadryl   Rolaids       Chamomile Tea  Tums (Limit 4/day)     Unisom         Tylenol  PM         Warm milk-add vanilla or  Hemorrhoids:  Sugar for taste  Anusol/Anusol H.C.  (RX: Analapram 2.5%)  Sugar Substitutes:  Hydrocortisone OTC     Ok in moderation  Preparation H      Tucks        Vaseline lotion applied to tissue with  wiping    Herpes:     Throat:  Acyclovir      Oragel  Famvir  Valtrex     Vaccines:         Flu Shot Leg Cramps:       *Gardasil  Benadryl       Hepatitis A         Hepatitis B Nasal Spray:       Pneumovax  Saline Nasal Spray     Polio Booster         Tetanus Nausea:       Tuberculosis test or PPD  Vitamin B6 25 mg TID   AVOID:    Dramamine      *Gardasil  Emetrol       Live Poliovirus  Ginger Root 250 mg QID    MMR (measles, mumps &  High Complex Carbs @ Bedtime    rebella)  Sea Bands-Accupressure    Varicella (Chickenpox)  Unisom 1/2 tab TID     *No known complications           If received before Pain:         Known pregnancy;   Darvocet       Resume series after  Lortab        Delivery  Percocet    Yeast:   Tramadol      Femstat  Tylenol  3      Gyne-lotrimin  Ultram       Monistat  Vicodin           MISC:         All Sunscreens           Hair Coloring/highlights          Insect Repellant's          (Including DEET)         Mystic Tans   Commonly Asked Questions During Pregnancy  Cats: A parasite can be excreted in cat feces.  To avoid exposure you need to have another person empty the little box.  If you must empty the litter box you will need to wear gloves.  Wash your hands after handling your cat.  This parasite can also be found in raw or undercooked meat so this should also be avoided.  Colds, Sore Throats, Flu: Please check your medication sheet to see what you can take for symptoms.  If your symptoms are unrelieved by these medications please call the office.  Dental Work: Most any dental work Agricultural consultant recommends is permitted.  X-rays should only be taken during the first trimester if absolutely necessary.  Your abdomen should be shielded with a lead apron during all x-rays.  Please notify your provider prior to receiving any x-rays.  Novocaine is fine; gas is not recommended.  If your dentist requires a note from us  prior to dental work please call the  office and we will provide one for you.  Exercise: Exercise is an important part of staying healthy during your pregnancy.  You may continue most exercises you were accustomed to prior to pregnancy.  Later in your pregnancy you will most likely notice you have difficulty with activities requiring balance like riding a bicycle.  It  is important that you listen to your body and avoid activities that put you at a higher risk of falling.  Adequate rest and staying well hydrated are a must!  If you have questions about the safety of specific activities ask your provider.    Exposure to Children with illness: Try to avoid obvious exposure; report any symptoms to us  when noted,  If you have chicken pos, red measles or mumps, you should be immune to these diseases.   Please do not take any vaccines while pregnant unless you have checked with your OB provider.  Fetal Movement: After 28 weeks we recommend you do "kick counts" twice daily.  Lie or sit down in a calm quiet environment and count your baby movements "kicks".  You should feel your baby at least 10 times per hour.  If you have not felt 10 kicks within the first hour get up, walk around and have something sweet to eat or drink then repeat for an additional hour.  If count remains less than 10 per hour notify your provider.  Fumigating: Follow your pest control agent's advice as to how long to stay out of your home.  Ventilate the area well before re-entering.  Hemorrhoids:   Most over-the-counter preparations can be used during pregnancy.  Check your medication to see what is safe to use.  It is important to use a stool softener or fiber in your diet and to drink lots of liquids.  If hemorrhoids seem to be getting worse please call the office.   Hot Tubs:  Hot tubs Jacuzzis and saunas are not recommended while pregnant.  These increase your internal body temperature and should be avoided.  Intercourse:  Sexual intercourse is safe during pregnancy as  long as you are comfortable, unless otherwise advised by your provider.  Spotting may occur after intercourse; report any bright red bleeding that is heavier than spotting.  Labor:  If you know that you are in labor, please go to the hospital.  If you are unsure, please call the office and let us  help you decide what to do.  Lifting, straining, etc:  If your job requires heavy lifting or straining please check with your provider for any limitations.  Generally, you should not lift items heavier than that you can lift simply with your hands and arms (no back muscles)  Painting:  Paint fumes do not harm your pregnancy, but may make you ill and should be avoided if possible.  Latex or water based paints have less odor than oils.  Use adequate ventilation while painting.  Permanents & Hair Color:  Chemicals in hair dyes are not recommended as they cause increase hair dryness which can increase hair loss during pregnancy.  " Highlighting" and permanents are allowed.  Dye may be absorbed differently and permanents may not hold as well during pregnancy.  Sunbathing:  Use a sunscreen, as skin burns easily during pregnancy.  Drink plenty of fluids; avoid over heating.  Tanning Beds:  Because their possible side effects are still unknown, tanning beds are not recommended.  Ultrasound Scans:  Routine ultrasounds are performed at approximately 20 weeks.  You will be able to see your baby's general anatomy an if you would like to know the gender this can usually be determined as well.  If it is questionable when you conceived you may also receive an ultrasound early in your pregnancy for dating purposes.  Otherwise ultrasound exams are not routinely performed unless there is a medical  necessity.  Although you can request a scan we ask that you pay for it when conducted because insurance does not cover " patient request" scans.  Work: If your pregnancy proceeds without complications you may work until your due  date, unless your physician or employer advises otherwise.  Round Ligament Pain/Pelvic Discomfort:  Sharp, shooting pains not associated with bleeding are fairly common, usually occurring in the second trimester of pregnancy.  They tend to be worse when standing up or when you remain standing for long periods of time.  These are the result of pressure of certain pelvic ligaments called "round ligaments".  Rest, Tylenol  and heat seem to be the most effective relief.  As the womb and fetus grow, they rise out of the pelvis and the discomfort improves.  Please notify the office if your pain seems different than that described.  It may represent a more serious condition.

## 2024-05-13 ENCOUNTER — Telehealth: Payer: Self-pay

## 2024-05-13 NOTE — Telephone Encounter (Signed)
 Called pt, no answer, [facility].

## 2024-05-13 NOTE — Telephone Encounter (Signed)
-----   Message from Forestine Igo sent at 05/09/2024  3:15 PM EDT ----- Regarding: RE: US  f/u I think since it was read by an MD and we have the formal report & image it is acceptable to confirm dating. ----- Message ----- From: Higinio Love, CMA Sent: 05/09/2024   2:51 PM EDT To: Forestine Igo, CNM Subject: US  f/u                                         Hi Jessica. Patient had an US  done at [facility]. There is a report scanned under media in her chart. Are we good with this report or do we need to order her another Dating Scan?

## 2024-06-05 ENCOUNTER — Other Ambulatory Visit (HOSPITAL_COMMUNITY)
Admission: RE | Admit: 2024-06-05 | Discharge: 2024-06-05 | Disposition: A | Discharge status: Home / Self Care | Source: Ambulatory Visit | Attending: Certified Nurse Midwife | Admitting: Certified Nurse Midwife

## 2024-06-05 ENCOUNTER — Ambulatory Visit (INDEPENDENT_AMBULATORY_CARE_PROVIDER_SITE_OTHER): Admitting: Certified Nurse Midwife

## 2024-06-05 VITALS — BP 123/68 | HR 87 | Wt 185.3 lb

## 2024-06-05 DIAGNOSIS — Z3480 Encounter for supervision of other normal pregnancy, unspecified trimester: Secondary | ICD-10-CM

## 2024-06-05 DIAGNOSIS — Z3A12 12 weeks gestation of pregnancy: Secondary | ICD-10-CM

## 2024-06-05 DIAGNOSIS — Z113 Encounter for screening for infections with a predominantly sexual mode of transmission: Secondary | ICD-10-CM

## 2024-06-05 DIAGNOSIS — Z124 Encounter for screening for malignant neoplasm of cervix: Secondary | ICD-10-CM | POA: Diagnosis present

## 2024-06-05 DIAGNOSIS — Z3401 Encounter for supervision of normal first pregnancy, first trimester: Secondary | ICD-10-CM

## 2024-06-05 DIAGNOSIS — Z369 Encounter for antenatal screening, unspecified: Secondary | ICD-10-CM

## 2024-06-05 DIAGNOSIS — Z3481 Encounter for supervision of other normal pregnancy, first trimester: Secondary | ICD-10-CM | POA: Diagnosis not present

## 2024-06-05 DIAGNOSIS — Z1379 Encounter for other screening for genetic and chromosomal anomalies: Secondary | ICD-10-CM

## 2024-06-05 DIAGNOSIS — Z0184 Encounter for antibody response examination: Secondary | ICD-10-CM

## 2024-06-05 NOTE — Progress Notes (Unsigned)
 NEW OB HISTORY AND PHYSICAL  SUBJECTIVE:       Christine May is a 23 y.o. G55P0020 female, Patient's last menstrual period was 03/12/2024 (exact date)., Estimated Date of Delivery: 12/17/24, [redacted]w[redacted]d, presents today for establishment of Prenatal Care. She reports {:313260}   Social history Partner/Relationship: Living situation: Work: Exercise: Substance use:  Indications for ASA therapy (per uptodate) One of the following: Previous pregnancy with preeclampsia, especially early onset and with an adverse outcome {yes/no:20286} Multifetal gestation {yes/no:20286} Chronic hypertension {yes/no:20286} Type 1 or 2 diabetes mellitus {yes/no:20286} Chronic kidney disease {yes/no:20286} Autoimmune disease (antiphospholipid syndrome, systemic lupus erythematosus) {yes/no:20286}  Two or more of the following: Nulliparity {yes/no:20286} Obesity (body mass index >30 kg/m2) {yes/no:20286} Family history of preeclampsia in mother or sister {yes/no:20286} Age >=35 years {yes/no:20286} Sociodemographic characteristics (African American race, low socioeconomic level) {yes/no:20286} Personal risk factors (eg, previous pregnancy with low birth weight or small for gestational age infant, previous adverse pregnancy outcome [eg, stillbirth], interval >10 years between pregnancies) {yes/no:20286}   Gynecologic History Patient's last menstrual period was 03/12/2024 (exact date). {Blank multiple:19196:: Normal,Abnormal,Unknown} Contraception: {method:5051} Last Pap: ***. 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     No current facility-administered medications on file prior to visit.    No Known Allergies  Social History   Socioeconomic History   Marital status: Significant Other    Spouse name: Not on file   Number of children: Not on file   Years of education: Not on file   Highest education level: 10th grade  Occupational History   Occupation: Occupational psychologist: [employer]  Tobacco Use   Smoking status: Never   Smokeless tobacco: Never  Vaping Use   Vaping status: Former   Substances: Nicotine, Flavoring  Substance and Sexual Activity   Alcohol use: Not Currently   Drug use: Not Currently    Frequency: 14.0 times per week    Types: Marijuana    Comment: 2 blunts twice a day   Sexual activity: Yes    Partners: Male    Birth control/protection: None  Other Topics Concern   Not on file  Social History Narrative   Not on file   Social Drivers of Health   Financial Resource Strain: Low Risk  (05/09/2024)   Overall Financial Resource Strain (CARDIA)    Difficulty of Paying Living Expenses: Not hard at all  Food Insecurity: No Food Insecurity (05/09/2024)   Hunger Vital  Sign    Worried About Programme researcher, broadcasting/film/video in the Last Year: Never true    Ran Out of Food in the Last Year: Never true  Transportation Needs: No Transportation Needs (05/09/2024)   PRAPARE - Administrator, Civil Service (Medical): No    Lack of Transportation (Non-Medical): No  Physical Activity: Sufficiently Active (05/09/2024)   Exercise Vital Sign    Days of Exercise per Week: 5 days    Minutes of Exercise per Session:  150+ min  Stress: Stress Concern Present (05/09/2024)   [employer] of Occupational Health - Occupational Stress Questionnaire    Feeling of Stress : To some extent  Social Connections: Socially Isolated (05/09/2024)   Social Connection and Isolation Panel    Frequency of Communication with Friends and Family: More than three times a week    Frequency of Social Gatherings with Friends and Family: More than three times a week    Attends Religious Services: Never    Database administrator or Organizations: No    Attends Engineer, structural: Not on file    Marital Status: Never married  Intimate Partner Violence: Not At Risk (05/09/2024)   Humiliation, Afraid, Rape, and Kick questionnaire    Fear of Current or Ex-Partner: No    Emotionally Abused: No    Physically Abused: No    Sexually Abused: No    Family History  Problem Relation Age of Onset   Restless legs syndrome Mother    Strabismus Mother    Skin cancer Mother    Epilepsy Father    Skin cancer Maternal Grandmother    Diabetes Paternal Grandfather     The following portions of the patient's history were reviewed and updated as appropriate: allergies, current medications, past OB history, past medical history, past surgical history, past family history, past social history, and problem list.  Constitutional: Denied constitutional symptoms, night sweats, recent illness, fatigue, fever, insomnia and weight loss.  Eyes: Denied eye symptoms, eye pain, photophobia, vision change and visual disturbance.  Ears/Nose/Throat/Neck: Denied ear, nose, throat or neck symptoms, hearing loss, nasal discharge, sinus congestion and sore throat.  Cardiovascular: Denied cardiovascular symptoms, arrhythmia, chest pain/pressure, edema, exercise intolerance, orthopnea and palpitations.  Respiratory: Denied pulmonary symptoms, asthma, pleuritic pain, productive sputum, cough, dyspnea and wheezing.  Gastrointestinal: Denied  gastro-esophageal reflux, melena, nausea and vomiting.  Genitourinary:*** Denied genitourinary symptoms including symptomatic vaginal discharge, pelvic relaxation issues, and urinary complaints.  Musculoskeletal: Denied musculoskeletal symptoms, stiffness, swelling, muscle weakness and myalgia.  Dermatologic: Denied dermatology symptoms, rash and scar.  Neurologic: Denied neurology symptoms, dizziness, headache, neck pain and syncope.  Psychiatric: Denied psychiatric symptoms, anxiety and depression.  Endocrine: Denied endocrine symptoms including hot flashes and night sweats.     OBJECTIVE: Initial Physical Exam (New OB)  Physical Exam Vitals reviewed.  Constitutional:      General: She is not in acute distress.    Appearance: Normal appearance.  HENT:     Head: Normocephalic.  Neck:     Thyroid: No thyroid mass or thyromegaly.   Cardiovascular:     Rate and Rhythm: Normal rate and regular rhythm.     Heart sounds: Normal heart sounds.  Pulmonary:     Effort: Pulmonary effort is normal.     Breath sounds: Normal breath sounds.  Chest:  Breasts:    Tanner Score is 5.     Right: No inverted nipple, mass or tenderness.     Left: No inverted  nipple, mass or tenderness.  Abdominal:     Palpations: Abdomen is soft.     Tenderness: There is no abdominal tenderness.  Genitourinary:    General: Normal vulva.   Musculoskeletal:     Cervical back: Neck supple. No tenderness.   Skin:    General: Skin is warm and dry.   Neurological:     General: No focal deficit present.     Mental Status: She is alert and oriented to person, place, and time.   Psychiatric:        Mood and Affect: Mood and affect normal.        Behavior: Behavior is cooperative.    FHR: 155  ASSESSMENT: {pregnancy state assessment:313271::Normal pregnancy}    PLAN: Routine prenatal care. We discussed an overview of prenatal care and when to call. Reviewed diet, exercise, and weight gain  recommendations in pregnancy. Discussed benefits of breastfeeding and lactation resources at [facility]. I reviewed labs and answered all questions.  1. Supervision of other normal pregnancy, antepartum (Primary) - NOB Panel; Future - Culture, OB Urine - Monitor Drug Profile 14(MW) - Nicotine screen, urine - Urinalysis, Routine w reflex microscopic - Cervicovaginal ancillary only - Cytology - PAP  2. Antenatal screening encounter - NOB Panel; Future - Culture, OB Urine - Monitor Drug Profile 14(MW) - Nicotine screen, urine - Urinalysis, Routine w reflex microscopic - Cervicovaginal ancillary only - Cytology - PAP  3. Genetic screening - NOB Panel; Future  4. Screen for sexually transmitted diseases - NOB Panel; Future - Cervicovaginal ancillary only  5. Immunity status testing - NOB Panel; Future  6. [redacted] weeks gestation of pregnancy  7. Cervical cancer screening - Cytology - PAP  8. Encounter for supervision of normal first pregnancy in first trimester - PANORAMA PRENATAL TEST  9. Encounter for supervision of other normal pregnancy, first trimester - HORIZON Basic Panel   Forestine Igo, CNM

## 2024-06-05 NOTE — Patient Instructions (Signed)
 First Trimester of Pregnancy  The first trimester of pregnancy starts on the first day of your last monthly period until the end of week 13. This is months 1 through 3 of pregnancy. A week after a sperm fertilizes an egg, the egg will implant into the wall of the uterus and begin to develop into a baby. Body changes during your first trimester Your body goes through many changes during pregnancy. The changes usually return to normal after your baby is born. Physical changes Your breasts may grow larger and may hurt. The area around your nipples may get darker. Your periods will stop. Your hair and nails may grow faster. You may pee more often. Health changes You may tire easily. Your gums may bleed and may be sensitive when you brush and floss. You may not feel hungry. You may have heartburn. You may throw up or feel like you may throw up. You may want to eat some foods, but not others. You may have headaches. You may have trouble pooping (constipation). Other changes Your emotions may change from day to day. You may have more dreams. Follow these instructions at home: Medicines Talk to your health care provider if you're taking medicines. Ask if the medicines are safe to take during pregnancy. Your provider may change the medicines that you take. Do not take any medicines unless told to by your provider. Take a prenatal vitamin that has at least 600 micrograms (mcg) of folic acid. Do not use herbal medicines, illegal substances, or medicines that are not approved by your provider. Eating and drinking While you're pregnant your body needs extra food for your growing baby. Talk with your provider about what to eat while pregnant. Activity Most women are able to exercise during pregnancy. Exercises may need to change as your pregnancy goes on. Talk to your provider about your activities and exercise routines. Relieving pain and discomfort Wear a good, supportive bra if your breasts  hurt. Rest with your legs raised if you have leg cramps or low back pain. Safety Wear your seatbelt at all times when you're in a car. Talk to your provider if someone hits you, hurts you, or yells at you. Talk with your provider if you're feeling sad or have thoughts of hurting yourself. Lifestyle Certain things can be harmful while you're pregnant. Follow these rules: Do not use hot tubs, steam rooms, or saunas. Do not douche. Do not use tampons or scented pads. Do not drink alcohol,smoke, vape, or use products with nicotine or tobacco in them. If you need help quitting, talk with your provider. Avoid cat litter boxes and soil used by cats. These things carry germs that can cause harm to your pregnancy and your baby. General instructions Keep all follow-up visits. It helps you and your unborn baby stay as healthy as possible. Write down your questions. Take them to your visits. Your provider will: Talk with you about your overall health. Give you advice or refer you to specialists who can help with different needs, including: Prenatal education classes. Mental health and counseling. Foods and healthy eating. Ask for help if you need help with food. Call your dentist and ask to be seen. Brush your teeth with a soft toothbrush. Floss gently. Where to find more information American Pregnancy Association: americanpregnancy.org [employer]: acog.org Office on [employer] Health: TravelLesson.ca Contact a health care provider if: You feel dizzy, faint, or have a fever. You vomit or have watery poop (diarrhea) for 2  days or more. You have abnormal discharge or bleeding from your vagina. You have pain when you pee or your pee smells bad. You have cramps, pain, or pressure in your belly area. Get help right away if: You have trouble breathing or chest pain. You have any kind of injury, such as from a fall or a car crash. These symptoms may be an  emergency. Get help right away. Call 911. Do not wait to see if the symptoms will go away. Do not drive yourself to the hospital. This information is not intended to replace advice given to you by your health care provider. Make sure you discuss any questions you have with your health care provider. Document Revised: 09/06/2023 Document Reviewed: 04/06/2023 Elsevier Patient Education  2024 [employer]. [employer] Gain During Pregnancy Gaining some weight during pregnancy is normal and healthy. The amount of weight you should gain depends on your health and weight before pregnancy. Talk with your health care provider to find out the right amount of weight gain for you. The suggested weight gain is based on your body mass index, or BMI. Here's a general guide based on your weight before pregnancy: If you're underweight or have a BMI less than 18.5, you should gain 28-40 lb (13-18 kg). If you're at a normal weight with a BMI of 18.5-24.9, you should gain 25-35 lb (11-16 kg). If you're overweight with a BMI of 25-29.9, you should gain 15-25 lb (7-11 kg). If you're obese with a BMI of 30 or higher, you should gain 11-20 lb (5-9 kg). Your provider may suggest that you try to gain weight slower or gain more weight based on what's best for your baby and your health. What are the risks of unhealthy weight gain for me? Gaining too much weight during pregnancy can lead to: A type of diabetes that can happen during pregnancy. This is called gestational diabetes. Hypertensive disorders of pregnancy. These are problems that cause high blood pressure. Having a difficult delivery or a C-section. What are the risks of unhealthy weight gain for my baby? Gaining too little weight during pregnancy can lead to: Loss of the pregnancy. An early (preterm) birth. Your baby not growing normally. Your baby having a low weight at birth. Gaining too much weight during pregnancy can lead to: Your baby growing  larger than normal during pregnancy. Your baby having an increased risk of obesity. What actions can I take to gain a healthy amount of weight during pregnancy? Nutrition  Eat healthy foods. Every day, try to eat: Fruits and vegetables of various colors and kinds. Whole grains, like whole-wheat breads and oatmeal. Low-fat dairy foods such as yogurt, milk, and cheese. Or try non-dairy alternatives from soy or almond. Protein-rich foods, like lean meat, chicken, eggs, and beans. Avoid foods that are fried or have a lot of fat, salt, or sugar. Drink more fluids as told. Choose healthy snacks and drinks: Drink water. Avoid soda, sports drinks, and juices that have added sugar. Avoid drinks with caffeine, such as coffee and energy drinks. Eat snacks that are high in protein, such as nuts, protein bars, and low-fat yogurt. Carry snacks with you that don't need refrigeration, such as trail mix, an apple, or a bar. If you need help improving your diet, work with your provider or an expert in healthy eating called a dietitian. Activity  Exercise regularly, as told by your provider. If you were active before you became pregnant, you may be able to continue your  regular fitness activities. If you were not active before pregnancy, you may slowly build up to exercising for 30 or more minutes on most days of the week. This may include walking, swimming, or yoga. Ask your provider what activities are safe for you. Follow these instructions at home: Take medicines only as told. Take all prenatal supplements as told. Keep track of your weight gain during pregnancy. Keep all prenatal health care visits. These visits are a good time to discuss your weight gain. Your provider will check on your health and the health of your baby. Where to find support If you have questions or need help learning about healthy weight gain during pregnancy, these people may help: Your health care provider. A  dietitian. Where to find more information To learn more: Go to bitchilla.com. Click Search and type weight gain. Find the link you need. Contact a health care provider if: You're not able to eat or drink for longer than 24 hours. You can't afford food or have trouble getting regular meals. This information is not intended to replace advice given to you by your health care provider. Make sure you discuss any questions you have with your health care provider. Document Revised: 10/26/2023 Document Reviewed: 10/26/2023 Elsevier Patient Education  2025 [employer]. Genetic Testing During Pregnancy: What to Know Genetic testing is done when you're pregnant to check if your baby might have a congenital condition. A congenital condition is something a baby is born with, also called a birth condition. These conditions can happen when genes or chromosomes are not normal. Genes are tiny parts in your body that make up chromosomes. Chromosomes are groups of many genes. Together, they tell your body how to look and work. Genes are passed down from parents to their baby. Why is genetic testing done during pregnancy? Genetic testing allows you to: Talk about your test results and future plans with your health care team. Plan for a baby that may be born with a congenital condition. Make plans with your health care team in case your baby needs special care before or after birth. Think about your options regarding whether you want to continue with the pregnancy. Types of genetic tests A genetic test can be a screening test or a diagnostic test. Screening tests     Screening tests are used to check the risk of your baby having a congenital condition. They don't show if your baby actually has the condition. More testing will be needed to know for sure. Screening tests are recommended for all pregnant people. Screening tests will not hurt your baby. Types of screening tests include: Carrier  screening. The parents' blood or saliva is tested to check for genes that aren't normal. These genes can be passed to the baby. If both parents have the gene, the baby is at risk. First-trimester screening. This includes a maternal blood test and an ultrasound of your baby. This test checks for a risk of conditions related to chromosomes. It also looks for problems with your baby's heart, belly, or bones. Second-trimester screening. This may include a maternal blood test and an ultrasound of your baby. This test checks for the risk of conditions related to chromosomes. It also looks for problems with many parts of your baby's body. These include the brain, nose, mouth, spine, heart, and arms or legs. Some people may only have an ultrasound and not have a blood test. Combined or sequential screening. This looks at the results from the blood tests in the  first and second trimesters, along with the findings of the first-trimester ultrasound. It helps to tell you more about your baby. This type of testing may be more accurate than just doing screening in the first or second trimester by itself. Cell-free DNA testing. During pregnancy, cells from your placenta get into your blood, which is normal. This test is a blood test that looks at those cells. It's done after 10 weeks of pregnancy. It can be used to check for the risk of conditions caused by having too many chromosomes or an abnormal number of sex chromosomes.  Diagnostic tests Diagnostic tests are done only if your baby is known to be at risk of having a congenital condition. These tests check the cells from your baby to diagnose a condition. Examples of these tests include: Chorionic villus sampling (CVS). This is a procedure where cells are taken from the placenta for testing. To do this, a needle is put into your belly using guided ultrasound. Amniocentesis. This is a procedure where amniotic fluid is removed from the sac around your baby. The cells  from the placenta or amniotic fluid are tested for chromosomes that are not normal. What do the results mean? For a screening test: If your results are negative, it means that your baby is most likely not at a higher risk for a condition. There's still a small chance your baby could have a condition. If your results are positive, it means that your baby's risk for a condition is higher than normal. Your health care provider may want you to have a diagnostic test. For a diagnostic test: If the result is negative, it's not likely that your baby will have a condition. If the test is positive, your baby most likely has a condition. Talk with your provider about what your results mean and what your options are. Questions to ask your health care provider Talk with your provider about the conditions that run in your family. Ask these questions: Is my baby at risk for a congenital condition? What are the benefits of having genetic screening? Should I meet with a genetic counselor? Should my partner or other members of my family be tested? What tests are best for me and my baby? How much do the tests cost? Will my insurance cover the testing? What are the risks of each test? This information is not intended to replace advice given to you by your health care provider. Make sure you discuss any questions you have with your health care provider. Document Revised: 10/25/2023 Document Reviewed: 10/25/2023 Elsevier Patient Education  2025 [employer]. Pregnancy: Healthy Eating While you're pregnant, your body needs extra nutrition for your growing baby. You also need more vitamins and minerals, such as folic acid, calcium, iron, and vitamin D. Eating a balanced diet is important for both you and your baby. Your need for extra calories will change during pregnancy. During the first 3 months of pregnancy, called the first trimester, you don't need more calories. During the second trimester, you'll need  about 340 extra calories a day. During the third trimester, you'll need about 450 extra calories a day. If you're carrying more than one baby, talk with your health care provider or a dietitian to learn more about your specific eating needs. What are tips for eating healthy during pregnancy? Meal planning  Eating smaller meals throughout the day may help manage some side effects common in pregnancy, like heartburn and reflux. Eat a variety of foods. Be sure to include  many types of fruits and vegetables. Two or more servings of fish are recommended each week. Choose fish that are lower in mercury, such as salmon and pollock. Limit foods that have empty calories. These are foods that have little nutritional value, such as sweets, desserts, candies, and drinks with sugar in them. Drinks that have caffeine are OK to drink, but it's better to avoid caffeine. Limit your total caffeine intake to less than 200 mg each day, or the limit you're told by your provider. Be aware that 200 mg of caffeine is 12 oz or 355 mL of coffee, tea, or soda. General information Take a prenatal vitamin to help meet your vitamin and mineral needs during pregnancy. This includes your need for folic acid, iron, calcium, and vitamin D. Do not try to lose weight or go on a diet during pregnancy. Food safety  Wash your hands before you eat and after you prepare raw meat. Wash all fruits and vegetables well before peeling or eating. Make sure that all meats, poultry, and eggs are cooked to food-safe temperatures or well-done. Taking these actions can help keep your food safe and protect you and your baby from dangerous food illnesses. Ask your provider for more information. What foods should I eat? Fruits All fruits. Eat a variety of colors and types of fruit. Remember to wash your fruits well before peeling or eating. Vegetables All vegetables. Eat a variety of colors and types of vegetables. Remember to wash your  vegetables well before peeling or eating. Grains All grains. Choose whole grains, such as whole-wheat bread, oatmeal, or brown rice. Meats and other protein foods Lean meats, including chicken, [place], and lean cuts of beef, veal, or pork. Fish that is higher in omega-3 fatty acids and lower in mercury, such as salmon, herring, mussels, trout, sardines, pollock, shrimp, crab, and lobster. Tofu. Tempeh. Beans. Eggs. Peanut butter and other nut butters. Dairy Pasteurized milk and milk alternatives, such as soy milk. Pasteurized yogurt and pasteurized cheese. Cottage cheese. Sour cream. Beverages Water. Juices that contain 100% fruit juice or vegetable juice. Caffeine-free teas and decaffeinated coffee. Fats and oils Fats and oils are OK to include in moderation. Sweets and desserts Sweets and desserts are OK to include in moderation. Seasoning and other foods All pasteurized condiments. The items listed above may not be all the foods and drinks you can have. Talk with a dietitian to learn more. What does 340 extra calories look like? Healthy snacks that give you 340 more calories a day could be: Peanut butter and jelly with milk: 8 oz (237 mL) of low-fat milk. Peanut butter and jelly sandwich made with: 1 slice of whole-wheat bread. 2 teaspoons (10 g) of peanut butter. Yogurt and berries: 1 cup (245 g) of [place] yogurt. 1 cup (150 g) of berries. 2 tablespoons (30 g) of chopped nuts, such as almonds or walnuts. Avocado toast: 1 slice of whole-wheat bread. 1/2 medium avocado (70 g). 1 large egg (50 g). What foods should I avoid? Fruits Raw (unpasteurized) fruit juices. Vegetables Unpasteurized vegetable juices. Meats and other protein foods Precooked or cured meat, such as bologna, hot dogs, sausages, or meat loaves. (If you must eat those meats, reheat them until they are steaming hot.) Refrigerated pate, meat spreads from a meat counter, or smoked seafood that's found in the  refrigerated section of a store. Raw or undercooked meats, poultry, and eggs. Raw fish, such as sushi or sashimi. Fish that have high mercury content, such as tilefish,  shark, swordfish, and king mackerel. Dairy Unpasteurized or raw milk and any foods that are made from them. Some of these may be: Homemade yogurts or puddings. Soft cheeses such as: Feta. Queso blanco or fresco. Pharmacist, hospital or [place]. Blue-veined cheeses. Some of these types of cheeses may be made with pasteurized milk. Check the label. If pasteurized milk is used, they are OK to eat during pregnancy. Deli foods Premade foods from a store or deli, like chicken salad, coleslaw, or egg salad. These are riskier for food illness than fresh or homemade salads. Beverages Alcohol. Sugar-sweetened drinks, such as sodas or teas. Energy drinks. Seasoning and other foods Homemade fermented foods and drinks, such as: Pickles. Sauerkraut. Kombucha. Store-bought pasteurized versions of these are OK. The items listed above may not be all the foods and drinks you should avoid. Talk with a dietitian to learn more. Where to find more information To learn more, go to: Centers for Disease Control and Prevention at TonerPromos.no. Click Search and type food choices for pregnancy. Find the link you need. MyPlate at http://pittman-dennis.biz/. This information is not intended to replace advice given to you by your health care provider. Make sure you discuss any questions you have with your health care provider. Document Revised: 11/21/2023 Document Reviewed: 11/21/2023 Elsevier Patient Education  2025 [employer]. Second Trimester of Pregnancy  The second trimester of pregnancy is from week 14 through week 27. This is months 4 through 6 of pregnancy. During the second trimester: Morning sickness is less or has stopped. You may have more energy. You may feel hungry more often. At this time, your unborn baby is growing very fast. At the end  of the sixth month, the unborn baby may be up to 12 inches long and weigh about 1 pounds. You will likely start to feel the baby move between 16 and 20 weeks of pregnancy. Body changes during your second trimester Your body continues to change during this time. The changes usually go away after your baby is born. Physical changes You will gain more weight. Your belly will get bigger. You may begin to get stretch marks on your hips, belly, and breasts. Your breasts will keep growing and may hurt. You may get dark spots or blotches on your face. A dark line from your belly button to the pubic area may appear. This line is called linea nigra. Your hair may grow faster and get thicker. Health changes You may have headaches. You may have heartburn. You may pee more often. You may have swollen, bulging veins (varicose veins). You may have trouble pooping (constipation), or swollen veins in the butt that can itch or get painful (hemorrhoids). You may have back pain. This is caused by: Weight gain. Pregnancy hormones that are relaxing the joints in your pelvis. Follow these instructions at home: Medicines Talk to your health care provider if you're taking medicines. Ask if the medicines are safe to take during pregnancy. Your provider may change the medicines that you take. Do not take any medicines unless told to by your provider. Take a prenatal vitamin that has at least 600 micrograms (mcg) of folic acid. Do not use herbal medicines, illegal drugs, or medicines that are not approved by your provider. Eating and drinking While you're pregnant your body needs extra food for your growing baby. Talk with your provider about what to eat while pregnant. Activity Most women are able to exercise during pregnancy. Exercises may need to change as your pregnancy goes on. Talk to  your provider about your activities and exercise routines. Relieving pain and discomfort Wear a good, supportive bra if  your breasts hurt. Rest with your legs raised if you have leg cramps or low back pain. Take warm sitz baths to soothe pain from hemorrhoids. Use hemorrhoid cream if your provider says it's okay. Do not douche. Do not use tampons or scented pads. Do not use hot tubs, steam rooms, or saunas. Safety Wear your seatbelt at all times when you're in a car. Talk to your provider if someone hits you, hurts you, or yells at you. Talk with your provider if you're feeling sad or have thoughts of hurting yourself. Lifestyle Certain things can be harmful while you're pregnant. It's best to avoid the following: Do not drink alcohol,smoke, vape, or use products with nicotine or tobacco in them. If you need help quitting, talk with your provider. Avoid cat litter boxes and soil used by cats. These things carry germs that can cause harm to your pregnancy and your baby. General instructions Keep all follow-up visits. It helps you and your unborn baby stay as healthy as possible. Write down your questions. Take them to your prenatal visits. Your provider will: Talk with you about your overall health. Give you advice or refer you to specialists who can help with different needs, including: Prenatal education classes. Mental health and counseling. Foods and healthy eating. Ask for help if you need help with food. Where to find more information American Pregnancy Association: americanpregnancy.org [employer]: acog.org Office on [employer] Health: TravelLesson.ca Contact a health care provider if: You have a headache that does not go away when you take medicine. You have any of these problems: You can't eat or drink. You throw up or feel like you may throw up. You have watery poop (diarrhea) for 2 days or more. You have pain when you pee or your pee smells bad. You have been sick for 2 days or more and are not getting better. Contact your provider right away if: You  have any of these coming from your vagina: Abnormal discharge. Bad-smelling fluid. Bleeding. Your baby is moving less than usual. You have contractions, belly cramping, or have pain in your pelvis or lower back. You have symptoms of high blood pressure or preeclampsia. These include: A severe, throbbing headache that does not go away. Sudden or extreme swelling of your face, hands, legs, or feet. Vision problems: You see spots. You have blurry vision. Your eyes are sensitive to light. If you can't reach the provider, go to an urgent care or emergency room. Get help right away if: You faint, become confused, or can't think clearly. You have chest pain or trouble breathing. You have any kind of injury, such as from a fall or a car crash. These symptoms may be an emergency. Call 911 right away. Do not wait to see if the symptoms will go away. Do not drive yourself to the hospital. This information is not intended to replace advice given to you by your health care provider. Make sure you discuss any questions you have with your health care provider. Document Revised: 09/06/2023 Document Reviewed: 04/06/2023 Elsevier Patient Education  2024 Elsevier Inc. Benefits of Breastfeeding Breastfeeding has many health benefits for babies and their breastfeeding parents. Breast milk is the best form of nutrition for babies. Breastfeeding is recommended until a child is 46 years old or older, as long as this is wanted by both the breastfeeding parent and child. Breastfeeding  can be challenging, especially during the first few weeks after your baby is born. Ask your health care provider or breastfeeding specialist (lactation consultant) for more information and help if needed. Asking for help early can provide the support you need for successful breastfeeding. Breastfeeding is not the only way to feed your baby breast milk. Some parents choose not to feed their babies directly from the breast. Instead,  they pump and bottle-feed their babies expressed breast milk. How do breastfeeding and pumping benefit me? Breastfeeding and pumping may: Help to create a bond between you and your baby. Slow bleeding after you give birth. Help your uterus return to its size before pregnancy. Help you lose the weight gained during pregnancy. Lower your risk of developing type 2 diabetes, weak bones (osteoporosis), rheumatoid arthritis, cardiovascular disease, and some forms of cancer later in life. How does breast milk benefit my baby? Nutrients in breast milk are better for your baby compared to nutrients in infant formula. Breast milk is easier for your baby to digest. The first milk (colostrum)helps your baby's digestive system function better. Breast milk lowers the risk of problems with the stomach and intestines. This includes necrotizing enterocolitis (NEC) in newborns. NEC is the inflammation and death of tissue in the intestine. Babies born early (premature) are at a higher risk of developing NEC. As your baby grows, your body changes the nutrients in your breast milk to meet your baby's needs. Breast milk improves your baby's brain development. Breast milk lowers your baby's risk for sudden infant death syndrome (SIDS). Breast milk can lower your baby's risk of: Ear infections. Infections of the nose, throat, or airways (respiratory infections). Allergies. Obesity. Diabetes. What are breast milk antibodies? Breast milk antibodies are substances that are part of the body's disease-fighting system (immune system). Antibodies help your baby fight off infections. Breast milk antibodies protect your baby from illnesses that you have: Had yourself. Been vaccinated against. Been exposed to while breastfeeding or pumping. What are other benefits of breastfeeding and bottle-feeding expressed breast milk? Breast milk is free. Breastfeeding may be convenient. In an emergency situation, formula shortage,  or natural disaster, you will be able to feed your baby without the need for safe water or infant formula. If you are exclusively pumping, manual breast pumps are available to express breast milk. You will need to boil pump parts to keep them clean between uses. If you do not have access to clean water or a manual breast pump, you can hand express your milk. Do this by compressing and massaging the milk ducts of each breast and catching the breast milk in a bottle or storage container. Where to find more information [employer]: llli.org Breastfeeding, Centers for Disease Control and Prevention (CDC): TonerPromos.no Breastfeeding and Returning to the Workplace, Marine scientist for Disease Control and Prevention (CDC): TonerPromos.no [employer]ologists (ACOG) : acog.org WIC Breastfeeding Support: wicbreastfeeding.fns.usda.gov Contact a health care provider if: You are frustrated with breastfeeding. You are worried your baby is not getting enough milk. Signs of not getting enough milk include: Your baby is not gaining weight or loses weight. Your baby is more than 40 week old and wetting fewer than 6 diapers in a 24-hour period. You do not hear your baby swallowing during feeds. Your full breasts do not get softer after your baby breastfeeds. Your baby is frequently too sleepy to feed well or is crying and will not stop. You have pain or discomfort in your breasts such  as: Continued pain with breastfeeding. Breast engorgement that does not improve after 48-72 hours. Cracking or soreness in your nipples that does not get better with treatment. Bleeding from your nipples. This information is not intended to replace advice given to you by your health care provider. Make sure you discuss any questions you have with your health care provider. Document Revised: 12/21/2022 Document Reviewed: 11/24/2022 Elsevier Patient Education  2024 [employer]. The Dangers of Smoking  While Pregnant  Smoking while you're pregnant isn't good for you and your baby. The smoke from cigarettes, vapes, pipes, and cigars has chemicals in it that can cause cancer. The smoke also has a drug in it called nicotine. When you smoke, the chemicals and nicotine go into your body and can be passed on to your baby. This can affect your baby's growth. If you're pregnant or plan to become pregnant, talk with your health care provider about how to stop smoking. You have a much better chance of having a healthy pregnancy and a healthy baby if you don't smoke while pregnant. How does smoking affect me? Smoking makes you more likely to get many long-term diseases. These include: Cancer. Lung diseases. Heart disease. Smoking while you're pregnant can make you more likely to: Lose the pregnancy. Have a pregnancy that's outside of the uterus. This is called a tubal pregnancy. Give birth too early. Have your water break early. Have problems with the placenta, which is the organ that gives your baby nutrients and oxygen. How does smoking affect my baby? Before birth If you smoke while you're pregnant, it can: Lessen blood flow and oxygen to your baby. Affect your baby's brain and lungs. Slow your baby's growth. After birth Babies exposed to smoke during pregnancy: May show signs of nicotine withdrawal. May be born with a cleft lip or cleft palate. May be born too small. May have a high risk of: Serious and lifelong health problems. Sudden infant death syndrome (SIDS). What can I do to lower my risk of problems from smoking? Do not smoke, vape, or use nicotine or tobacco. Do not take nicotine supplements or medicine to help you quit smoking unless your provider tells you to. Stay away when people are smoking. Ask people who smoke to avoid smoking around you. Talk with your provider about ways to help you quit smoking. You may want to  try: Counseling. Psychotherapy. Acupuncture. Hypnosis. Phone hotlines for people trying to quit. Where to find more information To learn more about smoking while pregnant and how to quit, go to: March of Dimes: marchofdimes.org U.S. Department of Health and Human Services [facility]): women.smokefree.gov American Cancer Society (ACS): cancer.org For help to quit smoking, call the national quitline at: 1-800-QUIT-NOW 5097785836). Contact a health care provider if: You're having trouble quitting smoking. You smoke and are pregnant or plan to become pregnant. You start smoking again after you give birth. This information is not intended to replace advice given to you by your health care provider. Make sure you discuss any questions you have with your health care provider. Document Revised: 04/25/2023 Document Reviewed: 04/25/2023 Elsevier Patient Education  2024 Elsevier Inc. Problems to Watch for During Pregnancy During pregnancy, your body goes through many changes. Some changes may be uncomfortable. But most changes are not a serious problem. It's important to learn when certain signs and symptoms may be a problem. Talk with your health care provider about any medical conditions you have. Make sure you know the symptoms to watch for. Reporting problems  early will prevent complications. Problems to watch for during pregnancy You're more likely to get an infection during pregnancy. Let your provider know if you have signs of infection, such as: A fever. A bad-smelling fluid from your vagina. Peeing too often, wanting to pee urgently, or pain when you pee. Also, let your provider know if: You're very tired, you feel dizzy, or you faint. You have watery poop (diarrhea) for 24 hours or longer. You throw up or feel like throwing up for 24 hours or longer. You have cramping in your belly or have pain in your hips or lower back. You have spotting, bleeding, or leaking of fluid from your  vagina. You have pain, swelling, or redness in an arm or leg. You should also watch for signs of high blood pressure and preeclampsia. These signs can be very serious. They include: A headache that doesn't go away when you take medicine. Sudden or very bad swelling of your face, hands, legs, or feet. Problems seeing, such as: You see spots. You have blurry vision. You may be sensitive to light. Why it's important to watch for these problems Watching and reporting problems to your provider can help prevent complications that may affect you and your baby. These include: Higher risk of giving birth early. Infection that may be passed on to your baby. Higher risk for stillbirth. Follow these instructions at home:  Take your medicines only as told. Keep all follow-up visits. Your provider needs to monitor your health and your baby's health. Where to find more information To learn more, go to these websites: Centers for Disease Control and Prevention (CDC) at TonerPromos.no. Then: Click Health Topics A-Z. Type urgent maternal warning signs in the search box. [employer]st[employer]ians and Gynecologists (ACOG): acog.org Contact a health care provider if: You have any problems while you're pregnant. You feel your baby moving less than usual. You have any of these things: You have strong emotions, such as sadness or anxiety, that affect your daily life. You do not feel safe in your home. You use tobacco, alcohol, or drugs, and you need help to stop. Get help right away if: You faint, have a seizure, or cannot think clearly. You have chest pain or difficulty breathing. You have any of the following symptoms and you were unable to reach your provider: You have symptoms of infection, including a fever, or have vaginal bleeding. You have symptoms of high blood pressure or preeclampsia. You have signs or symptoms of labor before 37 weeks of pregnancy. These include: Contractions that are 5  minutes or less apart, or that increase in frequency, intensity, or length. Sudden, sharp pain in the belly, or low back pain. Any amount of fluid that flows from your vagina without stopping. These symptoms may be an emergency. Call 911 right away. Do not wait to see if the symptoms will go away. Do not drive yourself to the hospital. This information is not intended to replace advice given to you by your health care provider. Make sure you discuss any questions you have with your health care provider. Document Revised: 05/15/2023 Document Reviewed: 05/15/2023 Elsevier Patient Education  2024 [employer].

## 2024-06-06 LAB — CBC/D/PLT+RPR+RH+ABO+RUBIGG...
Antibody Screen: NEGATIVE
Basophils Absolute: 0 10*3/uL (ref 0.0–0.2)
Basos: 0 %
EOS (ABSOLUTE): 0.1 10*3/uL (ref 0.0–0.4)
Eos: 1 %
HCV Ab: NONREACTIVE
HIV Screen 4th Generation wRfx: NONREACTIVE
Hematocrit: 35.4 % (ref 34.0–46.6)
Hemoglobin: 11.3 g/dL (ref 11.1–15.9)
Hepatitis B Surface Ag: NEGATIVE
Immature Grans (Abs): 0 10*3/uL (ref 0.0–0.1)
Immature Granulocytes: 0 %
Lymphocytes Absolute: 1.5 10*3/uL (ref 0.7–3.1)
Lymphs: 28 %
MCH: 31.1 pg (ref 26.6–33.0)
MCHC: 31.9 g/dL (ref 31.5–35.7)
MCV: 98 fL — ABNORMAL HIGH (ref 79–97)
Monocytes Absolute: 0.4 10*3/uL (ref 0.1–0.9)
Monocytes: 8 %
Neutrophils Absolute: 3.5 10*3/uL (ref 1.4–7.0)
Neutrophils: 62 %
Platelets: 266 10*3/uL (ref 150–450)
RBC: 3.63 x10E6/uL — ABNORMAL LOW (ref 3.77–5.28)
RDW: 12.7 % (ref 11.7–15.4)
RPR Ser Ql: NONREACTIVE
Rh Factor: POSITIVE
Rubella Antibodies, IGG: 4 {index} (ref >0.99)
Varicella zoster IgG: NONREACTIVE
WBC: 5.6 10*3/uL (ref 3.4–10.8)

## 2024-06-06 LAB — HCV INTERPRETATION

## 2024-06-06 LAB — URINALYSIS, ROUTINE W REFLEX MICROSCOPIC
Bilirubin, UA: NEGATIVE
Glucose, UA: NEGATIVE
Leukocytes,UA: NEGATIVE
Nitrite, UA: NEGATIVE
RBC, UA: NEGATIVE
Specific Gravity, UA: 1.015 (ref 1.005–1.030)
Urobilinogen, Ur: 0.2 mg/dL (ref 0.2–1.0)
pH, UA: 8 — ABNORMAL HIGH (ref 5.0–7.5)

## 2024-06-07 LAB — URINE CULTURE, OB REFLEX

## 2024-06-07 LAB — CULTURE, OB URINE

## 2024-06-09 ENCOUNTER — Ambulatory Visit: Payer: Self-pay | Admitting: Certified Nurse Midwife

## 2024-06-09 ENCOUNTER — Encounter: Payer: Self-pay | Admitting: Certified Nurse Midwife

## 2024-06-09 DIAGNOSIS — Z2839 Other underimmunization status: Secondary | ICD-10-CM | POA: Insufficient documentation

## 2024-06-09 LAB — CERVICOVAGINAL ANCILLARY ONLY
Chlamydia: NEGATIVE
Comment: NEGATIVE
Comment: NORMAL
Neisseria Gonorrhea: NEGATIVE

## 2024-06-09 LAB — CYTOLOGY - PAP: Diagnosis: NEGATIVE

## 2024-06-10 LAB — PANORAMA PRENATAL TEST FULL PANEL:PANORAMA TEST PLUS 5 ADDITIONAL MICRODELETIONS: FETAL FRACTION: 11.5

## 2024-06-11 LAB — MONITOR DRUG PROFILE 14(MW)
Amphetamine Scrn, Ur: NEGATIVE ng/mL
BARBITURATE SCREEN URINE: NEGATIVE ng/mL
BENZODIAZEPINE SCREEN, URINE: NEGATIVE ng/mL
Buprenorphine, Urine: NEGATIVE ng/mL
Cocaine (Metab) Scrn, Ur: NEGATIVE ng/mL
Creatinine(Crt), U: 94.4 mg/dL (ref 20.0–300.0)
Fentanyl, Urine: NEGATIVE pg/mL
Meperidine Screen, Urine: NEGATIVE ng/mL
Methadone Screen, Urine: NEGATIVE ng/mL
OXYCODONE+OXYMORPHONE UR QL SCN: NEGATIVE ng/mL
Opiate Scrn, Ur: NEGATIVE ng/mL
Ph of Urine: 8.2 (ref 4.5–8.9)
Phencyclidine Qn, Ur: NEGATIVE ng/mL
Propoxyphene Scrn, Ur: NEGATIVE ng/mL
SPECIFIC GRAVITY: 1.012
Tramadol Screen, Urine: NEGATIVE ng/mL

## 2024-06-11 LAB — CANNABINOID (GC/MS), URINE
Cannabinoid: POSITIVE — AB
Carboxy THC (GC/MS): 81 ng/mL

## 2024-06-11 LAB — NICOTINE SCREEN, URINE: Cotinine Ql Scrn, Ur: NEGATIVE ng/mL

## 2024-06-12 LAB — HORIZON CUSTOM: REPORT SUMMARY: NEGATIVE

## 2024-07-03 ENCOUNTER — Ambulatory Visit (INDEPENDENT_AMBULATORY_CARE_PROVIDER_SITE_OTHER): Admitting: Obstetrics and Gynecology

## 2024-07-03 ENCOUNTER — Encounter: Payer: Self-pay | Admitting: Obstetrics and Gynecology

## 2024-07-03 VITALS — BP 122/73 | HR 94 | Wt 188.8 lb

## 2024-07-03 DIAGNOSIS — Z3480 Encounter for supervision of other normal pregnancy, unspecified trimester: Secondary | ICD-10-CM

## 2024-07-03 DIAGNOSIS — Z3689 Encounter for other specified antenatal screening: Secondary | ICD-10-CM | POA: Diagnosis not present

## 2024-07-03 DIAGNOSIS — Z1379 Encounter for other screening for genetic and chromosomal anomalies: Secondary | ICD-10-CM

## 2024-07-03 DIAGNOSIS — Z3A16 16 weeks gestation of pregnancy: Secondary | ICD-10-CM | POA: Diagnosis not present

## 2024-07-03 NOTE — Progress Notes (Signed)
 ROB. Patient states continuing to take prenatals. AFP today. Anatomy ultrasound ordered today. Patient states no questions or concerns at this time.

## 2024-07-03 NOTE — Progress Notes (Signed)
 ROB:  EGA = 16.1.  Reports nausea and vomiting has resolved.  She feels well.  AFP today.  Anatomy ultrasound with next visit.

## 2024-07-07 LAB — AFP, SERUM, OPEN SPINA BIFIDA
AFP MoM: 1.28
AFP Value: 37.1 ng/mL
Gest. Age on Collection Date: 16.1 wk
Maternal Age At EDD: 24.2 a
OSBR Risk 1 IN: 5096
Test Results:: NEGATIVE
Weight: 189 [lb_av]

## 2024-07-31 ENCOUNTER — Ambulatory Visit: Payer: MEDICAID

## 2024-07-31 ENCOUNTER — Encounter: Payer: MEDICAID | Admitting: Certified Nurse Midwife

## 2024-07-31 DIAGNOSIS — Z3A20 20 weeks gestation of pregnancy: Secondary | ICD-10-CM | POA: Diagnosis not present

## 2024-07-31 DIAGNOSIS — Z3689 Encounter for other specified antenatal screening: Secondary | ICD-10-CM

## 2024-07-31 DIAGNOSIS — Z3482 Encounter for supervision of other normal pregnancy, second trimester: Secondary | ICD-10-CM

## 2024-07-31 DIAGNOSIS — Z3480 Encounter for supervision of other normal pregnancy, unspecified trimester: Secondary | ICD-10-CM

## 2024-08-05 ENCOUNTER — Other Ambulatory Visit: Payer: Self-pay

## 2024-08-08 ENCOUNTER — Other Ambulatory Visit: Payer: Self-pay

## 2024-08-08 DIAGNOSIS — Z362 Encounter for other antenatal screening follow-up: Secondary | ICD-10-CM

## 2024-08-08 NOTE — Progress Notes (Signed)
 US  OB follow up order placed per 07/31/24 Anatomy Scan:  Impression: 1. [redacted]w[redacted]d Viable Singleton Intrauterine pregnancy by U/S. 2. (U/S) EDD is consistent with Clinically established Estimated Date of Delivery: 12/17/24 . 3. Normal Anatomy Scan   Recommendations: 1.Clinical correlation with the patient's History and Physical Exam. 2. Patient to return in 2 weeks for heart views and trv s spine view.

## 2024-08-11 ENCOUNTER — Ambulatory Visit (INDEPENDENT_AMBULATORY_CARE_PROVIDER_SITE_OTHER): Payer: MEDICAID | Admitting: Obstetrics & Gynecology

## 2024-08-11 VITALS — BP 110/69 | HR 86 | Wt 198.4 lb

## 2024-08-11 DIAGNOSIS — Z3A21 21 weeks gestation of pregnancy: Secondary | ICD-10-CM | POA: Diagnosis not present

## 2024-08-11 DIAGNOSIS — F322 Major depressive disorder, single episode, severe without psychotic features: Secondary | ICD-10-CM | POA: Diagnosis not present

## 2024-08-11 DIAGNOSIS — Z3480 Encounter for supervision of other normal pregnancy, unspecified trimester: Secondary | ICD-10-CM

## 2024-08-11 NOTE — Progress Notes (Signed)
   PRENATAL VISIT NOTE  Subjective:  Christine May is a 23 y.o. G3P0020 at [redacted]w[redacted]d being seen today for ongoing prenatal care.  She is currently monitored for the following issues for this low-risk pregnancy and has Major depression; Anxiety disorder of adolescence; Cephalalgia; Severe episode of recurrent major depressive disorder, without psychotic features (HCC); Severe single current episode of major depressive disorder, without psychotic features (HCC); MDD (major depressive disorder), severe (HCC); Persistent proteinuria; Supervision of other normal pregnancy, antepartum; History of spontaneous abortion, currently pregnant; History of induced abortion; and Susceptible to varicella (non-immune), currently pregnant on their problem list.  Patient reports no complaints.  Contractions: Not present. Vag. Bleeding: None.  Movement: Present. Denies leaking of fluid.   The following portions of the patient's history were reviewed and updated as appropriate: allergies, current medications, past family history, past medical history, past social history, past surgical history and problem list.   Objective:    Vitals:   08/11/24 1355  BP: 110/69  Pulse: 86  Weight: 198 lb 6.4 oz (90 kg)    Fetal Status:  Fetal Heart Rate (bpm): 133 Fundal Height: 21 cm Movement: Present    General: Alert, oriented and cooperative. Patient is in no acute distress.  Skin: Skin is warm and dry. No rash noted.   Cardiovascular: Normal heart rate noted  Respiratory: Normal respiratory effort, no problems with respiration noted  Abdomen: Soft, gravid, appropriate for gestational age.  Pain/Pressure: Absent     Pelvic: Cervical exam deferred        Extremities: Normal range of motion.  Edema: None  Mental Status: Normal mood and affect. Normal behavior. Normal judgment and thought content.   Assessment and Plan:  Pregnancy: G3P0020 at [redacted]w[redacted]d 1. Current severe episode of major depressive disorder without psychotic  features, unspecified whether recurrent (HCC) (Primary) - stable on no meds at this time  2. Supervision of other normal pregnancy, antepartum - we discussed that she has gained 23 pounds so far and that she needs to slow the weight gain DOWN  3. [redacted] weeks gestation of pregnancy - repeat scan for further details  Preterm labor symptoms and general obstetric precautions including but not limited to vaginal bleeding, contractions, leaking of fluid and fetal movement were reviewed in detail with the patient. Please refer to After Visit Summary for other counseling recommendations.   Return in about 4 weeks (around 09/08/2024).  Future Appointments  Date Time Provider Department Center  08/14/2024  2:30 PM [facility] US  1 [facility] None    Harland JAYSON Birkenhead, MD

## 2024-08-14 ENCOUNTER — Other Ambulatory Visit: Payer: MEDICAID

## 2024-08-14 DIAGNOSIS — Z3A23 23 weeks gestation of pregnancy: Secondary | ICD-10-CM | POA: Diagnosis not present

## 2024-08-14 DIAGNOSIS — Z362 Encounter for other antenatal screening follow-up: Secondary | ICD-10-CM

## 2024-09-08 ENCOUNTER — Encounter: Payer: Self-pay | Admitting: Obstetrics

## 2024-09-08 ENCOUNTER — Ambulatory Visit (INDEPENDENT_AMBULATORY_CARE_PROVIDER_SITE_OTHER): Payer: MEDICAID | Admitting: Obstetrics

## 2024-09-08 VITALS — BP 121/73 | HR 83 | Wt 202.0 lb

## 2024-09-08 DIAGNOSIS — Z3480 Encounter for supervision of other normal pregnancy, unspecified trimester: Secondary | ICD-10-CM | POA: Diagnosis not present

## 2024-09-08 DIAGNOSIS — F319 Bipolar disorder, unspecified: Secondary | ICD-10-CM | POA: Insufficient documentation

## 2024-09-08 DIAGNOSIS — Z131 Encounter for screening for diabetes mellitus: Secondary | ICD-10-CM

## 2024-09-08 DIAGNOSIS — D649 Anemia, unspecified: Secondary | ICD-10-CM

## 2024-09-08 DIAGNOSIS — Z113 Encounter for screening for infections with a predominantly sexual mode of transmission: Secondary | ICD-10-CM | POA: Diagnosis not present

## 2024-09-08 NOTE — Assessment & Plan Note (Addendum)
-  Not currently on meds. Having some irritability but otherwise stable. -Monthly check in with health dept.

## 2024-09-08 NOTE — Assessment & Plan Note (Addendum)
-  Discussed 28-week labs at next visit -Preliminary US  report shows anatomy is complete and normal -Encouraged to decrease dietary heart burn triggers. OK to take Tums. -Reviewed kick counts and preterm labor warning signs. Instructed to call office or come to hospital with persistent headache, vision changes, regular contractions, leaking of fluid, decreased fetal movement or vaginal bleeding.

## 2024-09-08 NOTE — Progress Notes (Signed)
    Return Prenatal Note   Assessment/Plan   Plan  23 y.o. G3P0020 at [redacted]w[redacted]d presents for follow-up OB visit. Reviewed prenatal record including previous visit note.  Bipolar disorder, unspecified (HCC) -Not currently on meds. Having some irritability but otherwise stable. -Monthly check in with health dept.  Supervision of other normal pregnancy, antepartum -Discussed 28-week labs at next visit -Preliminary US  report shows anatomy is complete and normal -Encouraged to decrease dietary heart burn triggers. OK to take Tums. -Reviewed kick counts and preterm labor warning signs. Instructed to call office or come to hospital with persistent headache, vision changes, regular contractions, leaking of fluid, decreased fetal movement or vaginal bleeding.     Orders Placed This Encounter  Procedures   28 Week RH+Panel    Standing Status:   Future    Expiration Date:   09/08/2025   Return in about 3 weeks (around 09/29/2024).   Future Appointments  Date Time Provider Department Center  09/29/2024  9:40 AM [facility] None  09/29/2024 10:35 AM Slaughterbeck, Damien, CNM [facility] None    For next visit:  Routine prenatal care    Subjective  Christine May is feeling well overall. She is having some heartburn d/t eating a lot of tomatoes and has been leaking breast milk. No concerns otherwise.  Movement: Present Contractions: Not present  Objective   Flow sheet Vitals: Pulse Rate: 83 BP: 121/73 Fundal Height: 27 cm Fetal Heart Rate (bpm): 130 Total weight gain: 27 lb (12.2 kg)  General Appearance  No acute distress, well appearing, and well nourished Pulmonary   Normal work of breathing Neurologic   Alert and oriented to person, place, and time Psychiatric   Mood and affect within normal limits  Christine May, CNM 09/08/24 5:57 PM

## 2024-09-25 NOTE — Progress Notes (Signed)
    Return Prenatal Note   Subjective   23 y.o. G3P0020 at [redacted]w[redacted]d presents for this follow-up prenatal visit.  Patient reports some lower leg edema.  Patient reports: Movement: Present Contractions: Irritability  Objective   Flow sheet Vitals: Pulse Rate: 87 BP: 114/65 Fundal Height: 27 cm Fetal Heart Rate (bpm): 140 Total weight gain: 34 lb 14.4 oz (15.8 kg)  General Appearance  No acute distress, well appearing, and well nourished Pulmonary   Normal work of breathing Neurologic   Alert and oriented to person, place, and time Psychiatric   Mood and affect within normal limits   Assessment/Plan   Plan  23 y.o. G3P0020 at [redacted]w[redacted]d presents for follow-up OB visit. Reviewed prenatal record including previous visit note.  Supervision of other normal pregnancy, antepartum 28 week labs drawn today. RhIG .n/a RPR, CBC, HIV and 1-hour GTT sent Reviewed the importance of monitoring fetal movement as well as warning signs for pre-eclampsia and preterm labor.       Orders Placed This Encounter  Procedures   Tdap vaccine greater than or equal to 7yo IM   No follow-ups on file.   Future Appointments  Date Time Provider Department Center  10/13/2024  1:55 PM Justino [name] HERO, CNM [facility] None    For next visit:  continue with routine prenatal care     Damien Parsley, CNM [facility]

## 2024-09-29 ENCOUNTER — Ambulatory Visit (INDEPENDENT_AMBULATORY_CARE_PROVIDER_SITE_OTHER): Payer: MEDICAID | Admitting: Certified Nurse Midwife

## 2024-09-29 ENCOUNTER — Other Ambulatory Visit: Payer: MEDICAID

## 2024-09-29 VITALS — BP 114/65 | HR 87 | Wt 209.9 lb

## 2024-09-29 DIAGNOSIS — Z3483 Encounter for supervision of other normal pregnancy, third trimester: Secondary | ICD-10-CM | POA: Diagnosis not present

## 2024-09-29 DIAGNOSIS — Z131 Encounter for screening for diabetes mellitus: Secondary | ICD-10-CM

## 2024-09-29 DIAGNOSIS — Z23 Encounter for immunization: Secondary | ICD-10-CM

## 2024-09-29 DIAGNOSIS — Z3A28 28 weeks gestation of pregnancy: Secondary | ICD-10-CM | POA: Diagnosis not present

## 2024-09-29 DIAGNOSIS — Z113 Encounter for screening for infections with a predominantly sexual mode of transmission: Secondary | ICD-10-CM

## 2024-09-29 DIAGNOSIS — Z3480 Encounter for supervision of other normal pregnancy, unspecified trimester: Secondary | ICD-10-CM

## 2024-09-29 DIAGNOSIS — D649 Anemia, unspecified: Secondary | ICD-10-CM

## 2024-09-29 NOTE — Assessment & Plan Note (Signed)
 28 week labs drawn today. RhIG n/a RPR, CBC, HIV and 1-hour GTT sent Reviewed the importance of monitoring fetal movement as well as warning signs for pre-eclampsia and preterm labor.

## 2024-09-30 LAB — 28 WEEK RH+PANEL
Basophils Absolute: 0 x10E3/uL (ref 0.0–0.2)
Basos: 0 %
EOS (ABSOLUTE): 0.1 x10E3/uL (ref 0.0–0.4)
Eos: 1 %
Gestational Diabetes Screen: 77 mg/dL (ref 70–139)
HIV Screen 4th Generation wRfx: NONREACTIVE
Hematocrit: 32.2 % — ABNORMAL LOW (ref 34.0–46.6)
Hemoglobin: 10.3 g/dL — ABNORMAL LOW (ref 11.1–15.9)
Immature Grans (Abs): 0.1 x10E3/uL (ref 0.0–0.1)
Immature Granulocytes: 1 %
Lymphocytes Absolute: 1.8 x10E3/uL (ref 0.7–3.1)
Lymphs: 21 %
MCH: 31.6 pg (ref 26.6–33.0)
MCHC: 32 g/dL (ref 31.5–35.7)
MCV: 99 fL — ABNORMAL HIGH (ref 79–97)
Monocytes Absolute: 0.7 x10E3/uL (ref 0.1–0.9)
Monocytes: 8 %
Neutrophils Absolute: 5.9 x10E3/uL (ref 1.4–7.0)
Neutrophils: 69 %
Platelets: 246 x10E3/uL (ref 150–450)
RBC: 3.26 x10E6/uL — ABNORMAL LOW (ref 3.77–5.28)
RDW: 12.3 % (ref 11.7–15.4)
RPR Ser Ql: NONREACTIVE
WBC: 8.5 x10E3/uL (ref 3.4–10.8)

## 2024-10-03 ENCOUNTER — Other Ambulatory Visit: Payer: Self-pay

## 2024-10-03 ENCOUNTER — Emergency Department
Admission: EM | Admit: 2024-10-03 | Discharge: 2024-10-03 | Disposition: A | Discharge status: Home / Self Care | Payer: Worker's Compensation | Source: Home / Self Care | Attending: Emergency Medicine | Admitting: Emergency Medicine

## 2024-10-03 ENCOUNTER — Telehealth: Payer: Self-pay

## 2024-10-03 ENCOUNTER — Observation Stay
Admission: EM | Admit: 2024-10-03 | Discharge: 2024-10-03 | Disposition: A | Discharge status: Home / Self Care | Payer: Worker's Compensation | Source: Ambulatory Visit | Attending: Emergency Medicine | Admitting: Emergency Medicine

## 2024-10-03 ENCOUNTER — Encounter: Payer: Self-pay | Admitting: Obstetrics and Gynecology

## 2024-10-03 DIAGNOSIS — O26893 Other specified pregnancy related conditions, third trimester: Principal | ICD-10-CM | POA: Insufficient documentation

## 2024-10-03 DIAGNOSIS — W010XXA Fall on same level from slipping, tripping and stumbling without subsequent striking against object, initial encounter: Secondary | ICD-10-CM | POA: Insufficient documentation

## 2024-10-03 DIAGNOSIS — Y92512 Supermarket, store or market as the place of occurrence of the external cause: Secondary | ICD-10-CM | POA: Insufficient documentation

## 2024-10-03 DIAGNOSIS — Z3490 Encounter for supervision of normal pregnancy, unspecified, unspecified trimester: Principal | ICD-10-CM

## 2024-10-03 DIAGNOSIS — M545 Low back pain, unspecified: Secondary | ICD-10-CM | POA: Diagnosis not present

## 2024-10-03 DIAGNOSIS — M7918 Myalgia, other site: Secondary | ICD-10-CM | POA: Insufficient documentation

## 2024-10-03 DIAGNOSIS — M79652 Pain in left thigh: Secondary | ICD-10-CM | POA: Diagnosis not present

## 2024-10-03 DIAGNOSIS — Y990 Civilian activity done for income or pay: Secondary | ICD-10-CM | POA: Insufficient documentation

## 2024-10-03 DIAGNOSIS — O9A213 Injury, poisoning and certain other consequences of external causes complicating pregnancy, third trimester: Secondary | ICD-10-CM | POA: Insufficient documentation

## 2024-10-03 DIAGNOSIS — Z3A29 29 weeks gestation of pregnancy: Secondary | ICD-10-CM | POA: Insufficient documentation

## 2024-10-03 DIAGNOSIS — W19XXXA Unspecified fall, initial encounter: Secondary | ICD-10-CM

## 2024-10-03 MED ORDER — ACETAMINOPHEN 325 MG PO TABS
650.0000 mg | ORAL_TABLET | Freq: Once | ORAL | Status: AC
  Administered 2024-10-03: 650 mg via ORAL
  Filled 2024-10-03: qty 2

## 2024-10-03 NOTE — Discharge Instructions (Addendum)
 You were seen in the emergency department following a fall.  You were offered an x-ray but denied at this time due to radiation exposure to your baby which is understandable.  Please return to the emergency department if you experience any new, worsening or concerning symptoms.  You may take Tylenol  as needed for pain as well as apply ice and a heating pad to any areas that hurt.  I have also given you a work note.  Please follow-up with your OB/GYN in the next few days to make sure you are fully cleared for work.  I have also provided you with a referral to primary care and a list of resources.  Please look through the resources and give one of their offices a call at your earliest convenience to establish care.

## 2024-10-03 NOTE — ED Notes (Signed)
 Pt seen in L&D just prior to coming downstairs to the ED to be evaluated herself. Baby evacuated upstairs and everything was normal per pt. Fetal heart tones completed in L&D prior to pt coming downstairs to ED.

## 2024-10-03 NOTE — OB Triage Note (Signed)
 Pt reports to labor and delivery with complaints of falling at work at approx 2pm.

## 2024-10-03 NOTE — ED Notes (Signed)
 Confirmed with patient, her job does not require drug test for workers comp.

## 2024-10-03 NOTE — ED Triage Notes (Signed)
 Pt comes with c/o back and let thigh. Pt states she had a fall at work and wants to file workers comp and get checked out. Pt is pregnant but already had baby checked.

## 2024-10-03 NOTE — Telephone Encounter (Signed)
 Patient calling in to state she was at work and experienced a hard fall. Reports she fell mainly on her bottom but it was a hard fall and her partner is very concerned. She states baby has been moving but she has not been to the restroom to rule out any vaginal bleeding. Recommended an evaluation on L&D as she and her partner are very nervous about the fall. Patient states she will go to [facility] shortly.

## 2024-10-03 NOTE — ED Provider Notes (Signed)
 [facility] Provider Note    Event Date/Time   First MD Initiated Contact with Patient 10/03/24 1842     (approximate)   History   Fall   HPI  Christine May is a 23 y.o. female  with a past medical history of MDD, ADHD, bipolar 1 disorder, depression, currently pregnant [redacted] weeks and 2 days, G2P0010 presents to the emergency department for a fall that occurred at work today.  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  Patient reports pain in her left back, buttocks and anterior thigh region.  States the pain in her back has resolved but her buttocks and anterior thigh are still having some pain mostly with hip flexion while walking. Patient has not taken anything for pain.  Patient did not hit her head, denies loss of consciousness.  Is not on any blood thinners.  Patient reports she has been feeling her baby kick.  Patient would like to file Worker's Comp.  Review of notes shows and patient expresses she was evaluated by OB/GYN team today with normal fetal Doppler, OB ultrasound.   Physical Exam   Triage Vital Signs: ED Triage Vitals [10/03/24 1823]  Encounter Vitals Group     BP 127/80     Girls Systolic BP Percentile      Girls Diastolic BP Percentile      Boys Systolic BP Percentile      Boys Diastolic BP Percentile      Pulse Rate 89     Resp 17     Temp (!) 97.5 F (36.4 C)     Temp src      SpO2 100 %     Weight      Height      Head Circumference      Peak Flow      Pain Score 6     Pain Loc      Pain Education      Exclude from Growth Chart     Most recent vital signs: Vitals:   10/03/24 1846 10/03/24 1926  BP:  122/79  Pulse:  78  Resp:  19  Temp:  98.3 F (36.8 C)  SpO2: 100% 100%    General: Awake, in no acute distress. Appears stated age. Head: Normocephalic, atraumatic. Neck: Supple, no nuchal  rigidity. CV: Good peripheral perfusion. DP pulses 2+ b/l. Respiratory:Normal respiratory effort.  No respiratory distress.  GI: Soft, non-distended, non-tender.  MSK: Normal ROM and  5/5 strength in b/l lower extremities including hips, knees, ankles as well as lumbar spine.  No specific area tender to palpation.  Endorses pain with left hip flexion and walking. Skin:Warm, dry, intact. No rashes, lesions, or ecchymosis. No cyanosis or pallor. Neurological: A&Ox4 to person, place, time, and situation. Sensation intact. Strength symmetric. No focal deficits.  Psychiatric: Mood and affect appropriate. Thought processes coherent.  No midline cervical, thoracic or lumbar tenderness. No CVA tenderness b/l.  ED Results / Procedures / Treatments   Labs (all labs ordered are listed, but only abnormal results are displayed) Labs Reviewed - No data to display   EKG     RADIOLOGY    PROCEDURES:  Critical Care performed: No   Procedures   MEDICATIONS ORDERED IN ED: Medications  acetaminophen  (TYLENOL ) tablet 650 mg (650 mg Oral Given 10/03/24 1926)  IMPRESSION / MDM / ASSESSMENT AND PLAN / ED COURSE  I reviewed the triage vital signs and the nursing notes.                              Differential diagnosis includes, but is not limited to, back strain, thigh strain, mechanical fall  Patient's presentation is most consistent with acute complicated illness / injury requiring diagnostic workup.  Patient is a 23 year old female with signs and symptoms as described above.  She is currently pregnant and was evaluated by OB team here at [facility] and found to have no acute issues with her current pregnancy.  She is hemodynamically stable, afebrile and has pain at a level of 5-6 in her thigh region.  Will give her a dose of Tylenol .   No overlying skin changes. Offered left hip/pelvic x-ray but patient denied at this time due to not wanting to expose her baby to radiation, which is  understandable, given she is able to ambulate well with a normal gait pattern, and can do all motions of her hip and lumbar regions.  Discussed reasons to return for absolutely necessary imaging. Work note will be provided.  Will have her follow-up with her OB/GYN outpatient.  Will also place a referral to primary care to establish care.  The patient may return to the emergency department for any new, worsening, or concerning symptoms. Patient was given the opportunity to ask questions; all questions were answered. Emergency department return precautions were discussed with the patient.  Patient is in agreement to the treatment plan.  Patient is stable for discharge.    FINAL CLINICAL IMPRESSION(S) / ED DIAGNOSES   Final diagnoses:  Fall, initial encounter  Acute pain of left thigh  Pain in left buttock     Rx / DC Orders   ED Discharge Orders          Ordered    Ambulatory Referral to Primary Care (Establish Care)        10/03/24 1935             Note:  This document was prepared using Dragon voice recognition software and may include unintentional dictation errors.     Sheron Salm, PA-C 10/03/24 1937    Claudene Rover, MD 10/06/24 [phone]

## 2024-10-13 ENCOUNTER — Ambulatory Visit (INDEPENDENT_AMBULATORY_CARE_PROVIDER_SITE_OTHER): Payer: MEDICAID | Admitting: Obstetrics

## 2024-10-13 ENCOUNTER — Encounter: Payer: Self-pay | Admitting: Obstetrics

## 2024-10-13 VITALS — BP 114/86 | HR 91 | Wt 213.0 lb

## 2024-10-13 DIAGNOSIS — D649 Anemia, unspecified: Secondary | ICD-10-CM | POA: Insufficient documentation

## 2024-10-13 DIAGNOSIS — Z3480 Encounter for supervision of other normal pregnancy, unspecified trimester: Secondary | ICD-10-CM

## 2024-10-13 MED ORDER — FERROUS SULFATE 325 (65 FE) MG PO TABS: 325.0000 mg | ORAL_TABLET | ORAL | 3 refills | Status: DC

## 2024-10-13 NOTE — Assessment & Plan Note (Addendum)
-  Plans an unmedicated birth. Encouraged CBE and hospital tour. Link sent via MyChart -Recommend Unisom for sleep -Reviewed kick counts and preterm labor warning signs. Instructed to call office or come to hospital with persistent headache, vision changes, regular contractions, leaking of fluid, decreased fetal movement or vaginal bleeding.

## 2024-10-13 NOTE — Progress Notes (Signed)
    Return Prenatal Note   Assessment/Plan   Plan  23 y.o. G3P0020 at [redacted]w[redacted]d presents for follow-up OB visit. Reviewed prenatal record including previous visit note.  Anemia -Rx sent for ferrous sulfate. Encouraged iron-rich foods  Supervision of other normal pregnancy, antepartum -Plans an unmedicated birth. Encouraged CBE and hospital tour. Link sent via MyChart -Recommend Unisom for sleep -Reviewed kick counts and preterm labor warning signs. Instructed to call office or come to hospital with persistent headache, vision changes, regular contractions, leaking of fluid, decreased fetal movement or vaginal bleeding.     No orders of the defined types were placed in this encounter.  No follow-ups on file.   Future Appointments  Date Time Provider Department Center  10/27/2024  2:35 PM Jayne Harlene CROME, CNM [facility] None    For next visit:  Routine prenatal care    Subjective   Christine May is having trouble staying asleep. This is affecting her mood. She is feeling well otherwise. Has not yet done CBE because she feels it will make her anxious, but she's open to considering it.  Movement: Present Contractions: Not present  Objective   Flow sheet Vitals: Pulse Rate: 91 BP: 114/86 Fundal Height: 30 cm Fetal Heart Rate (bpm): 130 Total weight gain: 38 lb (17.2 kg)  General Appearance  No acute distress, well appearing, and well nourished Pulmonary   Normal work of breathing Neurologic   Alert and oriented to person, place, and time Psychiatric   Mood and affect within normal limits  [name] Canny, CNM 10/13/24 2:36 PM

## 2024-10-13 NOTE — Assessment & Plan Note (Signed)
-  Rx sent for ferrous sulfate. Encouraged iron-rich foods

## 2024-10-27 ENCOUNTER — Ambulatory Visit: Payer: MEDICAID | Admitting: Certified Nurse Midwife

## 2024-10-27 VITALS — BP 121/70 | HR 92 | Wt 215.8 lb

## 2024-10-27 DIAGNOSIS — Z3483 Encounter for supervision of other normal pregnancy, third trimester: Secondary | ICD-10-CM

## 2024-10-27 DIAGNOSIS — Z23 Encounter for immunization: Secondary | ICD-10-CM | POA: Diagnosis not present

## 2024-10-27 DIAGNOSIS — Z2911 Encounter for prophylactic immunotherapy for respiratory syncytial virus (RSV): Secondary | ICD-10-CM

## 2024-10-27 DIAGNOSIS — Z3A32 32 weeks gestation of pregnancy: Secondary | ICD-10-CM | POA: Diagnosis not present

## 2024-10-27 DIAGNOSIS — Z3480 Encounter for supervision of other normal pregnancy, unspecified trimester: Secondary | ICD-10-CM

## 2024-10-27 NOTE — Assessment & Plan Note (Signed)
 Reviewed kick counts and preterm labor warning signs. Instructed to call office or come to hospital with persistent headache, vision changes, regular contractions, leaking of fluid, decreased fetal movement or vaginal bleeding. RSV & Flu vaccines today.

## 2024-10-27 NOTE — Progress Notes (Signed)
    Return Prenatal Note   Subjective   23 y.o. G3P0020 at [redacted]w[redacted]d presents for this follow-up prenatal visit.  Patient feeling well, active baby, no c/o. Nesting & getting ready for baby. Increased stress recently due to cat dying. Patient reports: Movement: Present Contractions: Irritability  Objective   Flow sheet Vitals: Pulse Rate: 92 BP: 121/70 Fundal Height: 32 cm Fetal Heart Rate (bpm): 130 Presentation: Vertex (Leopold's) Total weight gain: 40 lb 12.8 oz (18.5 kg)  General Appearance  No acute distress, well appearing, and well nourished Pulmonary   Normal work of breathing Neurologic   Alert and oriented to person, place, and time Psychiatric   Mood and affect within normal limits   Assessment/Plan   Plan  23 y.o. [id number] at [redacted]w[redacted]d presents for follow-up OB visit. Reviewed prenatal record including previous visit note.  Supervision of other normal pregnancy, antepartum Reviewed kick counts and preterm labor warning signs. Instructed to call office or come to hospital with persistent headache, vision changes, regular contractions, leaking of fluid, decreased fetal movement or vaginal bleeding. RSV & Flu vaccines today.      Orders Placed This Encounter  Procedures   Respiratory syncytial virus vaccine, preF, subunit, bivalent,(Abrysvo)   Flu vaccine trivalent PF, 6mos and older(Flulaval,Afluria,Fluarix,Fluzone)   Return in 2 weeks (on 11/10/2024) for ROB.   Future Appointments  Date Time Provider Department Center  11/10/2024  1:55 PM Dominic, Jinnie Jansky, CNM [facility] None     For next visit:  continue with routine prenatal care     Harlene LITTIE Cisco, CNM  11/10/253:00 PM

## 2024-10-27 NOTE — Patient Instructions (Addendum)
 Respiratory Syncytial Virus (RSV) Vaccine Injection What is this medication? RESPIRATORY SYNCYTIAL VIRUS VACCINE (reh SPIR uh tor ee sin SISH uhl VY rus vak SEEN) reduces the risk of respiratory syncytial virus (RSV). It does not treat RSV. It is still possible to get RSV after receiving this vaccine, but the symptoms may be less severe or not last as long. It works by helping your immune system learn how to fight off a future infection. This medicine may be used for other purposes; ask your health care provider or pharmacist if you have questions. COMMON BRAND NAME(S): ABRYSVO, AREXVY, mRESVIA What should I tell my care team before I take this medication? They need to know if you have any of these conditions: Immune system problems An unusual or allergic reaction to respiratory syncytial virus vaccine, other medications, foods, dyes, or preservatives Pregnant or trying to get pregnant Breastfeeding How should I use this medication? This vaccine is injected into a muscle. It is given by your care team. A copy of Vaccine Information Statements will be given before each vaccination. Be sure to read this information carefully each time. This sheet may change often. A copy of Vaccine Information Statements will be given before each vaccination. Be sure to read this information carefully each time. This sheet may change often. Talk to your care team about the use of this vaccine in children. It is not approved for use in children. Overdosage: If you think you have taken too much of this medicine contact a poison control center or emergency room at once. NOTE: This medicine is only for you. Do not share this medicine with others. What if I miss a dose? This does not apply. What may interact with this medication? Medications that lower your chance of fighting infection This list may not describe all possible interactions. Give your health care provider a list of all the medicines, herbs,  non-prescription drugs, or dietary supplements you use. Also tell them if you smoke, drink alcohol, or use illegal drugs. Some items may interact with your medicine. What should I watch for while using this medication? Visit your care team for regular health checks. Before you receive this vaccine, talk to your care team if you have an acute illness. Vaccines can be given to people with mild acute illness, such as the common cold or diarrhea. Discuss with your care team the risks and benefits of receiving this vaccine during a moderate to severe illness. Your care team may choose to wait to give you the vaccine when you feel better. Report any side effects to your care team or to the Vaccine Adverse Event Reporting System (VAERS) website at https://vaers.lagents.no. This is only for reporting side effects; VAERs staff do not give medical advice. What side effects may I notice from receiving this medication? Side effects that you should report to your care team as soon as possible: Allergic reactions--skin rash, itching, hives, swelling of the face, lips, tongue, or throat Feeling faint or lightheaded Weakness and tingling in the feet and legs that spreads to upper body Side effects that usually do not require medical attention (report these to your care team if they continue or are bothersome): General discomfort and fatigue Headache Muscle pain Pain, redness, or irritation at injection site This list may not describe all possible side effects. Call your doctor for medical advice about side effects. You may report side effects to FDA at 1-800-FDA-1088. Where should I keep my medication? This vaccine is only given by your care  team. It will not be stored at home. NOTE: This sheet is a summary. It may not cover all possible information. If you have questions about this medicine, talk to your doctor, pharmacist, or health care provider.  2025 Elsevier/Gold Standard (2023-12-26 00:00:00)  Third  Trimester of Pregnancy  The third trimester of pregnancy is from week 28 through week 40. This is months 7 through 9. The third trimester is a time when your baby is growing fast. Body changes during your third trimester Your body continues to change during this time. The changes usually go away after your baby is born. Physical changes You will continue to gain weight. You may get stretch marks on your hips, belly, and breasts. Your breasts will keep growing and may hurt. A yellow fluid (colostrum) may leak from your breasts. This is the first milk you're making for your baby. Your hair may grow faster and get thicker. In some cases, you may get hair loss. Your belly button may stick out. You may have more swelling in your hands, face, or ankles. Health changes You may have heartburn. You may feel short of breath. This is caused by the uterus that is now bigger. You may have more aches in the pelvis, back, or thighs. You may have more tingling or numbness in your hands, arms, and legs. You may pee more often. You may have trouble pooping (constipation) or swollen veins in the butt that can itch or get painful (hemorrhoids). Other changes You may have more problems sleeping. You may notice the baby moving lower in your belly (dropping). You may have more fluid coming from your vagina. Your joints may feel loose, and you may have pain around your pelvic bone. Follow these instructions at home: Medicines Take medicines only as told by your health care provider. Some medicines are not safe during pregnancy. Your provider may change the medicines that you take. Do not take any medicines unless told to by your provider. Take a prenatal vitamin that has at least 600 micrograms (mcg) of folic acid. Do not use herbal medicines, illegal drugs, or medicines that are not approved by your provider. Eating and drinking While you're pregnant your body needs additional nutrition to help support your  growing baby. Talk with your provider about your nutritional needs. Activity Most women are able to exercise regularly during pregnancy. Exercise routines may need to change at the end of your pregnancy. Talk to your provider about your activities and exercise routine. Relieving pain and discomfort Rest often with your legs raised if you have leg cramps or low back pain. Take warm sitz baths to soothe pain from hemorrhoids. Use hemorrhoid cream if your provider says it's okay. Wear a good, supportive bra if your breasts hurt. Do not use hot tubs, steam rooms, or saunas. Do not douche. Do not use tampons or scented pads. Safety Talk to your provider before traveling far distances. Wear your seatbelt at all times when you're in a car. Talk to your provider if someone hits you, hurts you, or yells at you. Preparing for birth To prepare for your baby: Take childbirth and breastfeeding classes. Visit the hospital and tour the maternity area. Buy a rear-facing car seat. Learn how to install it in your car. General instructions Avoid cat litter boxes and soil used by cats. These things carry germs that can cause harm to your pregnancy and your baby. Do not drink alcohol, smoke, vape, or use products with nicotine  or tobacco in them. If  you need help quitting, talk with your provider. Keep all follow-up visits for your third trimester. Your provider will do more exams and tests during this trimester. Write down your questions. Take them to your prenatal visits. Your provider also will: Talk with you about your overall health. Give you advice or refer you to specialists who can help with different needs, including: Mental health and counseling. Foods and healthy eating. Ask for help if you need help with food. Where to find more information American Pregnancy Association: americanpregnancy.org [employer]: acog.org Office on [employer] Health:  [place].ca Contact a health care provider if: You have a headache that does not go away when you take medicine. You have any of these problems: You can't eat or drink. You have nausea and vomiting. You have watery poop (diarrhea) for 2 days or more. You have pain when you pee, or your pee smells bad. You have been sick for 2 days or more and aren't getting better. Contact your provider right away if: You have any of these coming from your vagina: Abnormal discharge. Bad-smelling fluid. Bleeding. Your baby is moving less than usual. You have signs of labor: You have any contractions, belly cramping, or have pain in your pelvis or lower back before 37 weeks of pregnancy (preterm labor). You have regular contractions that are less than 5 minutes apart. Your water breaks. You have symptoms of high blood pressure or preeclampsia. These include: A severe, throbbing headache that does not go away. Sudden or extreme swelling of your face, hands, legs, or feet. Vision problems: You see spots. You have blurry vision. Your eyes are sensitive to light. If you can't reach your provider, go to an urgent care or emergency room. Get help right away if: You faint, become confused, or can't think clearly. You have chest pain or trouble breathing. You have any kind of injury, such as from a fall or a car crash. These symptoms may be an emergency. Call 911 right away. Do not wait to see if the symptoms will go away. Do not drive yourself to the hospital. This information is not intended to replace advice given to you by your health care provider. Make sure you discuss any questions you have with your health care provider. Document Revised: 09/06/2023 Document Reviewed: 04/06/2023 Elsevier Patient Education  2024 [employer]. Third Trimester of Pregnancy  The third trimester of pregnancy is from week 28 through week 40. This is months 7 through 9. The third trimester is a time when your  baby is growing fast. Body changes during your third trimester Your body continues to change during this time. The changes usually go away after your baby is born. Physical changes You will continue to gain weight. You may get stretch marks on your hips, belly, and breasts. Your breasts will keep growing and may hurt. A yellow fluid (colostrum) may leak from your breasts. This is the first milk you're making for your baby. Your hair may grow faster and get thicker. In some cases, you may get hair loss. Your belly button may stick out. You may have more swelling in your hands, face, or ankles. Health changes You may have heartburn. You may feel short of breath. This is caused by the uterus that is now bigger. You may have more aches in the pelvis, back, or thighs. You may have more tingling or numbness in your hands, arms, and legs. You may pee more often. You may have trouble pooping (constipation)  or swollen veins in the butt that can itch or get painful (hemorrhoids). Other changes You may have more problems sleeping. You may notice the baby moving lower in your belly (dropping). You may have more fluid coming from your vagina. Your joints may feel loose, and you may have pain around your pelvic bone. Follow these instructions at home: Medicines Take medicines only as told by your health care provider. Some medicines are not safe during pregnancy. Your provider may change the medicines that you take. Do not take any medicines unless told to by your provider. Take a prenatal vitamin that has at least 600 micrograms (mcg) of folic acid. Do not use herbal medicines, illegal drugs, or medicines that are not approved by your provider. Eating and drinking While you're pregnant your body needs additional nutrition to help support your growing baby. Talk with your provider about your nutritional needs. Activity Most women are able to exercise regularly during pregnancy. Exercise routines  may need to change at the end of your pregnancy. Talk to your provider about your activities and exercise routine. Relieving pain and discomfort Rest often with your legs raised if you have leg cramps or low back pain. Take warm sitz baths to soothe pain from hemorrhoids. Use hemorrhoid cream if your provider says it's okay. Wear a good, supportive bra if your breasts hurt. Do not use hot tubs, steam rooms, or saunas. Do not douche. Do not use tampons or scented pads. Safety Talk to your provider before traveling far distances. Wear your seatbelt at all times when you're in a car. Talk to your provider if someone hits you, hurts you, or yells at you. Preparing for birth To prepare for your baby: Take childbirth and breastfeeding classes. Visit the hospital and tour the maternity area. Buy a rear-facing car seat. Learn how to install it in your car. General instructions Avoid cat litter boxes and soil used by cats. These things carry germs that can cause harm to your pregnancy and your baby. Do not drink alcohol, smoke, vape, or use products with nicotine  or tobacco in them. If you need help quitting, talk with your provider. Keep all follow-up visits for your third trimester. Your provider will do more exams and tests during this trimester. Write down your questions. Take them to your prenatal visits. Your provider also will: Talk with you about your overall health. Give you advice or refer you to specialists who can help with different needs, including: Mental health and counseling. Foods and healthy eating. Ask for help if you need help with food. Where to find more information American Pregnancy Association: americanpregnancy.org [employer]: acog.org Office on [employer] Health: [place].ca Contact a health care provider if: You have a headache that does not go away when you take medicine. You have any of these problems: You can't eat or  drink. You have nausea and vomiting. You have watery poop (diarrhea) for 2 days or more. You have pain when you pee, or your pee smells bad. You have been sick for 2 days or more and aren't getting better. Contact your provider right away if: You have any of these coming from your vagina: Abnormal discharge. Bad-smelling fluid. Bleeding. Your baby is moving less than usual. You have signs of labor: You have any contractions, belly cramping, or have pain in your pelvis or lower back before 37 weeks of pregnancy (preterm labor). You have regular contractions that are less than 5 minutes apart. Your water breaks. You  have symptoms of high blood pressure or preeclampsia. These include: A severe, throbbing headache that does not go away. Sudden or extreme swelling of your face, hands, legs, or feet. Vision problems: You see spots. You have blurry vision. Your eyes are sensitive to light. If you can't reach your provider, go to an urgent care or emergency room. Get help right away if: You faint, become confused, or can't think clearly. You have chest pain or trouble breathing. You have any kind of injury, such as from a fall or a car crash. These symptoms may be an emergency. Call 911 right away. Do not wait to see if the symptoms will go away. Do not drive yourself to the hospital. This information is not intended to replace advice given to you by your health care provider. Make sure you discuss any questions you have with your health care provider. Document Revised: 09/06/2023 Document Reviewed: 04/06/2023 Elsevier Patient Education  2024 Elsevier Inc. Problems to Watch for During Pregnancy During pregnancy, your body goes through many changes. Some changes may be uncomfortable. But most changes are not a serious problem. It's important to learn when certain signs and symptoms may be a problem. Talk with your health care provider about any medical conditions you have. Make sure you  know the symptoms to watch for. Reporting problems early will prevent complications. Problems to watch for during pregnancy You're more likely to get an infection during pregnancy. Let your provider know if you have signs of infection, such as: A fever. A bad-smelling fluid from your vagina. Peeing too often, wanting to pee urgently, or pain when you pee. Also, let your provider know if: You're very tired, you feel dizzy, or you faint. You have watery poop (diarrhea) for 24 hours or longer. You throw up or feel like throwing up for 24 hours or longer. You have cramping in your belly or have pain in your hips or lower back. You have spotting, bleeding, or leaking of fluid from your vagina. You have pain, swelling, or redness in an arm or leg. You should also watch for signs of high blood pressure and preeclampsia. These signs can be very serious. They include: A headache that doesn't go away when you take medicine. Sudden or very bad swelling of your face, hands, legs, or feet. Problems seeing, such as: You see spots. You have blurry vision. You may be sensitive to light. Why it's important to watch for these problems Watching and reporting problems to your provider can help prevent complications that may affect you and your baby. These include: Higher risk of giving birth early. Infection that may be passed on to your baby. Higher risk for stillbirth. Follow these instructions at home:  Take your medicines only as told. Keep all follow-up visits. Your provider needs to monitor your health and your baby's health. Where to find more information To learn more, go to these websites: Centers for Disease Control and Prevention (CDC) at tonerpromos.no. Then: Click Health Topics A-Z. Type urgent maternal warning signs in the search box. [employer] (ACOG): acog.org Contact a health care provider if: You have any problems while you're pregnant. You feel  your baby moving less than usual. You have any of these things: You have strong emotions, such as sadness or anxiety, that affect your daily life. You do not feel safe in your home. You use tobacco, alcohol, or drugs, and you need help to stop. Get help right away if: You faint, have  a seizure, or cannot think clearly. You have chest pain or difficulty breathing. You have any of the following symptoms and you were unable to reach your provider: You have symptoms of infection, including a fever, or have vaginal bleeding. You have symptoms of high blood pressure or preeclampsia. You have signs or symptoms of labor before 37 weeks of pregnancy. These include: Contractions that are 5 minutes or less apart, or that increase in frequency, intensity, or length. Sudden, sharp pain in the belly, or low back pain. Any amount of fluid that flows from your vagina without stopping. These symptoms may be an emergency. Call 911 right away. Do not wait to see if the symptoms will go away. Do not drive yourself to the hospital. This information is not intended to replace advice given to you by your health care provider. Make sure you discuss any questions you have with your health care provider. Document Revised: 05/15/2023 Document Reviewed: 05/15/2023 Elsevier Patient Education  2024 Elsevier Inc. Early Labor (Pre-Term Labor): What to Know Pregnancy normally lasts 39-41 weeks. Pre-term labor is when labor starts before you have been pregnant for 37 weeks. Babies who are born too early may be at an increased risk for long-term problems like cerebral palsy, developmental delays, and vision and hearing problems. Premature babies may also have problems soon after birth, such as problems with their blood sugar, body temperature, heart, and breathing. These babies often have trouble with feeding. These problems may be very serious in babies who are born before 34 weeks of pregnancy. What are the causes? The  cause of pre-term labor is not known. What increases the risk? You're more likely to have pre-term labor if: You have medical problems, now or in the past. You have problems now or in your past pregnancies. You have risk factors related to lifestyle, environment, and age. Medical history You have problems affecting your uterus, including a short cervix. You have a sexually transmitted infection (STI) or other infections of the urinary tract and the vagina. You have: Blood clotting problems. High blood pressure. High blood sugar. You have a low body weight or too much body weight. Present and past pregnancies You have had pre-term labor before. You're pregnant with more than one baby. The placenta covers your cervix,. This is called placenta previa. Your unborn baby has a congenital condition. This means your baby will be born with the condition. You have bleeding from your vagina while you're pregnant. You became pregnant through in vitro fertilization (IVF). Lifestyle and environmental factors You use drugs, tobacco products or drink alcohol. You have stress and no social support. You experience domestic violence. You're exposed to certain chemicals at home or work. Other factors You're younger than age 45 or older than age 35. What are the signs or symptoms?  Cramps like those that can happen during a menstrual period. The cramps may happen with diarrhea, which is watery poop. Pain your belly or lower back. Regular contractions. It may feel like your belly is getting tighter. Pressure in your pelvis. Increased watery or bloody discharge from your vagina. Your water breaking. How is this diagnosed? This condition is diagnosed based on: Your medical history and a physical exam. A pelvic exam. An ultrasound. Monitoring for contractions. Other tests, including: A swab of the cervix to check for a protein substance called fetal fibronectin. This protein is usually present in  the area between the uterus and the amniotic sac early in pregnancy and then again towards the end.  If it's found in the middle of pregnancy, it can sometimes be a sign of pre-term labor. Urine tests. How is this treated? Treatment depends on how far along your pregnancy is, the health of your baby, and your health. Treatment may include: Taking medicines to: Stop contractions. Help the baby's lungs mature if the risk of early delivery is high. Medicines to help prevent your baby from having cerebral palsy or other problems. Bed rest. If the labor happens before 34 weeks of pregnancy, you may need to stay in the hospital. Delivery of the baby. Follow these instructions at home: Do not smoke, vape, or use nicotine  or tobacco. Do not drink alcohol. Take your medicines only as told. Rest as told by your provider. Ask what things are safe for you to do at home. Ask when you can go back to work or school. Keep all follow-up visits. Your provider will need to closely follow your health and the health of your baby. How is this prevented? To increase your chance of having a full-term pregnancy: Do not use drugs or take medicines that have not been prescribed to you during your pregnancy. Talk with your provider before taking any herbal supplements, even if you have been taking them regularly. Gain a healthy amount of weight during your pregnancy. Watch for infection. If you think that you might have an infection, get it checked right away. Symptoms of infection may include: Fever. Discharge from your vagina that smells bad or is not normal. Pain or burning when you pee. Having to pee small amounts or very often. Blood in your pee. Where to find more information To learn more, go to these websites: U.S. Department of Health and Cytogeneticist on Women's Health: http://hoffman.com/ [employer]: www.acog.org Centers for Disease Control and  Prevention at Diningcalendar.de. Then: Click Search and type preterm labor. Find the link you need. Contact a health care provider if: You think you're going into pre-term labor. You have signs or symptoms of pre-term labor. You have symptoms of infection. Get help right away if: You're having regular, painful contractions every 5 minutes or less. Your water breaks. This information is not intended to replace advice given to you by your health care provider. Make sure you discuss any questions you have with your health care provider. Document Revised: 06/14/2023 Document Reviewed: 06/14/2023 Elsevier Patient Education  2024 [employer].

## 2024-10-30 ENCOUNTER — Encounter: Payer: Self-pay | Admitting: Certified Nurse Midwife

## 2024-11-10 ENCOUNTER — Ambulatory Visit: Payer: MEDICAID | Admitting: Licensed Practical Nurse

## 2024-11-10 ENCOUNTER — Encounter: Payer: Self-pay | Admitting: Licensed Practical Nurse

## 2024-11-10 VITALS — BP 127/80 | HR 91 | Wt 220.6 lb

## 2024-11-10 DIAGNOSIS — D649 Anemia, unspecified: Secondary | ICD-10-CM

## 2024-11-10 DIAGNOSIS — Z3A34 34 weeks gestation of pregnancy: Secondary | ICD-10-CM

## 2024-11-10 DIAGNOSIS — O99013 Anemia complicating pregnancy, third trimester: Secondary | ICD-10-CM | POA: Diagnosis not present

## 2024-11-10 DIAGNOSIS — Z3480 Encounter for supervision of other normal pregnancy, unspecified trimester: Secondary | ICD-10-CM

## 2024-11-10 MED ORDER — FERROUS SULFATE 325 (65 FE) MG PO TABS: 325.0000 mg | ORAL_TABLET | ORAL | 3 refills | Status: DC

## 2024-11-10 NOTE — Assessment & Plan Note (Signed)
-  script for Iron sent

## 2024-11-10 NOTE — Progress Notes (Signed)
    Return Prenatal Note   Subjective   23 y.o. G3P0020 at [redacted]w[redacted]d presents for this follow-up prenatal visit.  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, nature at [employer]   -has been reading about pregnancy and labor, his mother has had many children and talked to her,  -undecided about epidural  -leaking colostrum  -Unsure about contraception, did not like Nexplanon, may want to avoid hormones  -needs pediatrician   Movement: Present Contractions: Irritability  Objective   Flow sheet Vitals: Pulse Rate: 91 BP: 127/80 Fundal Height: 36 cm Fetal Heart Rate (bpm): 150 Total weight gain: 45 lb 9.6 oz (20.7 kg)  General Appearance  No acute distress, well appearing, and well nourished Pulmonary   Normal work of breathing Neurologic   Alert and oriented to person, place, and time Psychiatric   Mood and affect within normal limits   Assessment/Plan   Plan  23 y.o. H6E9979 at [redacted]w[redacted]d presents for follow-up OB visit. Reviewed prenatal record including previous visit note.  Supervision of other normal pregnancy, antepartum -TWG 45lbs, encouraged to evaluate habits, pt admits she does not always eat the best.  -reviewed blurry vision could be related to normal changes in the lens during pregnancy BP normal today, reviewed sxs preeclampsia  -36wk labs next visit  -warnings signs reviewed   Anemia -script for Iron sent       No orders of the defined types were placed in this encounter.  Return in about 2 weeks (around 11/24/2024) for . 36wk labs.   Future Appointments  Date Time Provider Department Center  11/24/2024  4:15 PM Jayne Harlene CROME, CNM [facility] None  12/01/2024  3:35 PM Thurley Francesconi, Jinnie Jansky, CNM [facility] None  12/08/2024  3:35 PM Slaughterbeck, Damien, CNM [facility] None  12/15/2024  3:35 PM Justino, [name] HERO, CNM [facility] None    For next visit:  ROB with GBS  screening      Armandina Iman M Jenisha Faison, CNM  11/24/255:18 PM

## 2024-11-10 NOTE — Assessment & Plan Note (Signed)
-  TWG 45lbs, encouraged to evaluate habits, pt admits she does not always eat the best.  -reviewed blurry vision could be related to normal changes in the lens during pregnancy BP normal today, reviewed sxs preeclampsia  -36wk labs next visit  -warnings signs reviewed

## 2024-11-24 ENCOUNTER — Ambulatory Visit: Payer: MEDICAID | Admitting: Certified Nurse Midwife

## 2024-11-24 ENCOUNTER — Other Ambulatory Visit (HOSPITAL_COMMUNITY)
Admission: RE | Admit: 2024-11-24 | Discharge: 2024-11-24 | Disposition: A | Discharge status: Home / Self Care | Payer: MEDICAID | Source: Ambulatory Visit | Attending: Certified Nurse Midwife | Admitting: Certified Nurse Midwife

## 2024-11-24 VITALS — BP 128/84 | HR 95 | Wt 225.0 lb

## 2024-11-24 DIAGNOSIS — Z113 Encounter for screening for infections with a predominantly sexual mode of transmission: Secondary | ICD-10-CM

## 2024-11-24 DIAGNOSIS — Z3685 Encounter for antenatal screening for Streptococcus B: Secondary | ICD-10-CM

## 2024-11-24 DIAGNOSIS — Z3A36 36 weeks gestation of pregnancy: Secondary | ICD-10-CM

## 2024-11-24 DIAGNOSIS — Z3480 Encounter for supervision of other normal pregnancy, unspecified trimester: Secondary | ICD-10-CM

## 2024-11-24 NOTE — Progress Notes (Signed)
    Return Prenatal Note   Subjective   23 y.o. H6E9979 at [redacted]w[redacted]d presents for this follow-up prenatal visit.  Patient feeling well, active baby, feeling more pelvic pressure, no regular contractions. Patient reports: Movement: Present Contractions: Irritability  Objective   Flow sheet Vitals: Pulse Rate: 95 BP: 128/84 Fundal Height: 37 cm Fetal Heart Rate (bpm): 150 Presentation: Vertex (BSUS) Total weight gain: 50 lb (22.7 kg)  General Appearance  No acute distress, well appearing, and well nourished Pulmonary   Normal work of breathing Neurologic   Alert and oriented to person, place, and time Psychiatric   Mood and affect within normal limits   Assessment/Plan   Plan  23 y.o. H6E9979 at [redacted]w[redacted]d presents for follow-up OB visit. Reviewed prenatal record including previous visit note.  Supervision of other normal pregnancy, antepartum Reviewed labor warning signs and expectations for birth. Instructed to call office or come to hospital with persistent headache, vision changes, regular contractions, leaking of fluid, decreased fetal movement or vaginal bleeding. GBS, GC/Ct collected today. Vertex confirmed on BSUS.      Orders Placed This Encounter  Procedures   Strep Gp B NAA   Return in 1 week (on 12/01/2024) for ROB.   Future Appointments  Date Time Provider Department Center  12/01/2024  3:35 PM Dominic, Jinnie Jansky, CNM [facility] None  12/08/2024  3:35 PM Slaughterbeck, Damien, CNM [facility] None  12/15/2024  3:35 PM Justino [name] HERO, CNM [facility] None    For next visit:  continue with routine prenatal care     Harlene LITTIE Cisco, CNM  12/08/255:17 PM

## 2024-11-24 NOTE — Assessment & Plan Note (Signed)
 Reviewed labor warning signs and expectations for birth. Instructed to call office or come to hospital with persistent headache, vision changes, regular contractions, leaking of fluid, decreased fetal movement or vaginal bleeding. GBS, GC/Ct collected today. Vertex confirmed on BSUS.

## 2024-11-24 NOTE — Patient Instructions (Signed)
 Signs and Symptoms of Labor Labor is the body's natural process of moving the baby and the placenta out of the uterus. The process of labor usually starts when the baby is full-term, between 74 and 41 weeks of pregnancy. Signs and symptoms that you are close to going into labor As your body prepares for labor and the birth of your baby, you may notice the following symptoms in the weeks and days before true labor starts: Passing a small amount of thick, bloody mucus from your vagina. This is called normal bloody show or losing your mucus plug. This may happen more than a week before labor begins, or right before labor begins, as the opening of the cervix starts to widen (dilate). For some women, the entire mucus plug passes at once. For others, pieces of the mucus plug may gradually pass over several days. Your baby moving (dropping) lower in your pelvis to get into position for birth (lightening). When this happens, you may feel more pressure on your bladder and pelvic bone and less pressure on your ribs. This may make it easier to breathe. It may also cause you to need to urinate more often and have problems with bowel movements. Having "practice contractions," also called Braxton Hicks contractions or false labor. These occur at irregular (unevenly spaced) intervals that are more than 10 minutes apart. False labor contractions are common after exercise or sexual activity. They will stop if you change position, rest, or drink fluids. These contractions are usually mild and do not get stronger over time. They may feel like: A backache or back pain. Mild cramps, similar to menstrual cramps. Tightening or pressure in your abdomen. Other early symptoms include: Nausea or loss of appetite. Diarrhea. Having a sudden burst of energy, or feeling very tired. Mood changes. Having trouble sleeping. Signs and symptoms that labor has begun Signs that you are in labor may include: Having contractions that come  at regular (evenly spaced) intervals and increase in intensity. This may feel like more intense tightening or pressure in your abdomen that moves to your back. Contractions may also feel like rhythmic pain in your upper thighs or back that comes and goes at regular intervals. If you are delivering for the first time, this change in intensity of contractions often occurs at a more gradual pace. If you have given birth before, you may notice a more rapid progression of contraction changes. Feeling pressure in the vaginal area. Your water breaking (rupture of membranes). This is when the sac of fluid that surrounds your baby breaks. Fluid leaking from your vagina may be clear or blood-tinged. Labor usually starts within 24 hours of your water breaking, but it may take longer to begin. Some people may feel a sudden gush of fluid; others may notice repeatedly damp underwear. Follow these instructions at home:  When labor starts, or if your water breaks, call your health care provider or nurse care line. Based on your situation, they will determine when you should go in for an exam. During early labor, you may be able to rest and manage symptoms at home. Some strategies to try at home include: Breathing and relaxation techniques. Taking a warm bath or shower. Listening to music. Using a heating pad on the lower back for pain. If directed, apply heat to the area as often as told by your health care provider. Use the heat source that your health care provider recommends, such as a moist heat pack or a heating pad. Place a  towel between your skin and the heat source. Leave the heat on for 20-30 minutes. Remove the heat if your skin turns bright red. This is especially important if you are unable to feel pain, heat, or cold. You have a greater risk of getting burned. Contact a health care provider if: Your labor has started. Your water breaks. You have nausea, vomiting, or diarrhea. Get help right away  if: You have painful, regular contractions that are 5 minutes apart or less. Labor starts before you are [redacted] weeks along in your pregnancy. You have a fever. You have bright red blood coming from your vagina. You do not feel your baby moving. You have a severe headache with or without vision problems. You have chest pain or shortness of breath. These symptoms may represent a serious problem that is an emergency. Do not wait to see if the symptoms will go away. Get medical help right away. Call your local emergency services (911 in the [place] Do not drive yourself to the hospital. Summary Labor is your body's natural process of moving your baby and the placenta out of your uterus. The process of labor usually starts when your baby is full-term, between 25 and 40 weeks of pregnancy. When labor starts, or if your water breaks, call your health care provider or nurse care line. Based on your situation, they will determine when you should go in for an exam. This information is not intended to replace advice given to you by your health care provider. Make sure you discuss any questions you have with your health care provider. Document Revised: 04/19/2021 Document Reviewed: 04/19/2021 Elsevier Patient Education  2024 [employer].

## 2024-11-26 LAB — CERVICOVAGINAL ANCILLARY ONLY
Chlamydia: NEGATIVE
Comment: NEGATIVE
Comment: NORMAL
Neisseria Gonorrhea: NEGATIVE

## 2024-11-27 NOTE — Progress Notes (Unsigned)
° ° °  Return Prenatal Note   Subjective   23 y.o. G3P0020 at 8w5dpresents for this follow-up prenatal visit.  Patient Here with her partner Patient reports: feeling tired, would like  to know what she can do to encourage labor  Movement: Present Contractions: Irritability  Objective   Flow sheet Vitals: Pulse Rate: 91 BP: 124/76 Fundal Height: 37 cm Fetal Heart Rate (bpm): 150 Total weight gain: 52 lb 9.6 oz (23.9 kg)  General Appearance  No acute distress, well appearing, and well nourished Pulmonary   Normal work of breathing Neurologic   Alert and oriented to person, place, and time Psychiatric   Mood and affect within normal limits   Assessment/Plan   Plan  23 y.o. H6E9979 at [redacted]w[redacted]d presents for follow-up OB visit. Reviewed prenatal record including previous visit note.  Supervision of other normal pregnancy, antepartum -mood is up and down  -TWG 52lbs -HO given-ways to encourage labor  -they have family available for PP support -warning signs reviewed        No orders of the defined types were placed in this encounter.  Return in about 1 week (around 12/08/2024) for ROB.   Future Appointments  Date Time Provider Department Center  12/08/2024  3:35 PM Slaughterbeck, Damien, CNM [facility] None  12/15/2024  3:35 PM Justino [name] HERO, CNM [facility] None    For next visit:  continue with routine prenatal care     JINNIE HERO [facility], CNM  12/16/202511:13 AM

## 2024-11-28 LAB — STREP GP B NAA: Strep Gp B NAA: NEGATIVE

## 2024-12-01 ENCOUNTER — Encounter: Payer: Self-pay | Admitting: Licensed Practical Nurse

## 2024-12-01 ENCOUNTER — Ambulatory Visit: Payer: MEDICAID | Admitting: Licensed Practical Nurse

## 2024-12-01 VITALS — BP 124/76 | HR 91 | Wt 227.6 lb

## 2024-12-01 DIAGNOSIS — Z3A37 37 weeks gestation of pregnancy: Secondary | ICD-10-CM

## 2024-12-01 DIAGNOSIS — Z3480 Encounter for supervision of other normal pregnancy, unspecified trimester: Secondary | ICD-10-CM

## 2024-12-01 NOTE — Assessment & Plan Note (Signed)
-  mood is up and down  -TWG 52lbs -HO given-ways to encourage labor  -they have family available for PP support -warning signs reviewed

## 2024-12-03 ENCOUNTER — Telehealth: Payer: Self-pay

## 2024-12-03 NOTE — Telephone Encounter (Signed)
 Christine May called triage line stating she was just at lunch and she felt dizzy and not feeling well, I asked her if she had a way to check her blood pressure she said no. I asked her if she got up too fast, getting plenty of water. I told her to monitor her symptoms if it happens again to get checked out. Pt understood.

## 2024-12-05 ENCOUNTER — Encounter: Payer: Self-pay | Admitting: Obstetrics and Gynecology

## 2024-12-05 ENCOUNTER — Other Ambulatory Visit: Payer: Self-pay

## 2024-12-05 ENCOUNTER — Observation Stay
Admission: EM | Admit: 2024-12-05 | Discharge: 2024-12-05 | Disposition: A | Discharge status: Home / Self Care | Payer: MEDICAID | Attending: Registered Nurse | Admitting: Registered Nurse

## 2024-12-05 DIAGNOSIS — Z3A38 38 weeks gestation of pregnancy: Secondary | ICD-10-CM | POA: Diagnosis not present

## 2024-12-05 DIAGNOSIS — O26893 Other specified pregnancy related conditions, third trimester: Secondary | ICD-10-CM | POA: Diagnosis present

## 2024-12-05 DIAGNOSIS — R519 Headache, unspecified: Secondary | ICD-10-CM | POA: Diagnosis not present

## 2024-12-05 DIAGNOSIS — O26899 Other specified pregnancy related conditions, unspecified trimester: Secondary | ICD-10-CM | POA: Diagnosis present

## 2024-12-05 DIAGNOSIS — Z3490 Encounter for supervision of normal pregnancy, unspecified, unspecified trimester: Principal | ICD-10-CM

## 2024-12-05 LAB — URINALYSIS, ROUTINE W REFLEX MICROSCOPIC
Bilirubin Urine: NEGATIVE
Glucose, UA: NEGATIVE mg/dL
Hgb urine dipstick: NEGATIVE
Ketones, ur: NEGATIVE mg/dL
Nitrite: NEGATIVE
Protein, ur: NEGATIVE mg/dL
Specific Gravity, Urine: 1.025 (ref 1.005–1.030)
pH: 5 (ref 5.0–8.0)

## 2024-12-05 MED ORDER — ACETAMINOPHEN 325 MG PO TABS: 650.0000 mg | ORAL_TABLET | ORAL | 0 refills | Status: DC | PRN

## 2024-12-05 MED ORDER — MAGNESIUM OXIDE -MG SUPPLEMENT 400 (240 MG) MG PO TABS
800.0000 mg | ORAL_TABLET | Freq: Once | ORAL | Status: AC
  Administered 2024-12-05: 800 mg via ORAL
  Filled 2024-12-05: qty 2

## 2024-12-05 MED ORDER — ACETAMINOPHEN 325 MG PO TABS
650.0000 mg | ORAL_TABLET | ORAL | Status: DC | PRN
  Administered 2024-12-05: 650 mg via ORAL
  Filled 2024-12-05: qty 2

## 2024-12-05 MED ORDER — METOCLOPRAMIDE HCL 10 MG PO TABS
10.0000 mg | ORAL_TABLET | Freq: Once | ORAL | Status: AC
  Administered 2024-12-05: 10 mg via ORAL
  Filled 2024-12-05: qty 1

## 2024-12-05 NOTE — OB Triage Note (Signed)
 Pt c/o having a HA since 0500 today. Denies having taken anything to relieve the HA. Denies blurred vision, seeing spots, and pain under her right breast. Does report dizziness. + fetal movement. Christine May

## 2024-12-05 NOTE — OB Triage Provider Note (Signed)
 LABOR & DELIVERY OB TRIAGE NOTE  SUBJECTIVE  HPI Christine May is a 23 y.o. G3P0020 at [redacted]w[redacted]d who presents to Labor & Delivery for evaluation of headache that started this morning around 0500. She has not taken anything for her headache. She says she drinks soda in her sleep, but tries to drink a lot of water during the day. She denies visual changes, epigastric pain.   OB History     Gravida  3   Para      Term      Preterm      AB  2   Living         SAB  1   IAB  1   Ectopic      Multiple      Live Births              PRN Meds:.acetaminophen   OBJECTIVE  BP 117/74   Pulse 71   Temp 98 F (36.7 C) (Oral)   Resp 16   Ht 5' 8 (1.727 m)   Wt 103.2 kg   LMP 03/12/2024 (Exact Date)   BMI 34.61 kg/m   General: NAD, conversant Heart: Well perfused Lungs: Normal WOB Abdomen: Gravid Cervical exam: Deferred  NST Baseline: 120's Variability: moderate Accelerations: Present Decelerations:none Toco: Quiet Category 1  Results for orders placed or performed during the hospital encounter of 12/05/24 (from the past 24 hours)  Urinalysis, Routine w reflex microscopic -Urine, Clean Catch     Status: Abnormal   Collection Time: 12/05/24  2:01 PM  Result Value Ref Range   Color, Urine YELLOW (A) YELLOW   APPearance CLOUDY (A) CLEAR   Specific Gravity, Urine 1.025 1.005 - 1.030   pH 5.0 5.0 - 8.0   Glucose, UA NEGATIVE NEGATIVE mg/dL   Hgb urine dipstick NEGATIVE NEGATIVE   Bilirubin Urine NEGATIVE NEGATIVE   Ketones, ur NEGATIVE NEGATIVE mg/dL   Protein, ur NEGATIVE NEGATIVE mg/dL   Nitrite NEGATIVE NEGATIVE   Leukocytes,Ua MODERATE (A) NEGATIVE   RBC / HPF 0-5 0 - 5 RBC/hpf   WBC, UA 11-20 0 - 5 WBC/hpf   Bacteria, UA RARE (A) NONE SEEN   Squamous Epithelial / HPF 21-50 0 - 5 /HPF   Mucus PRESENT    Ca Oxalate Crys, UA PRESENT     No results found.  ASSESSMENT/ PLAN 1) Pregnancy at G3P0020, [redacted]w[redacted]d, Estimated Date of Delivery: 12/17/24 2)  Reassuring maternal/fetal status. Normotensive. 3) Headache improved following treatment with tylenol , mag oxide, reglan, and po fluids. Encouraged to continue aggressive po hydration.  4) Return if headache is severe or persistent despite home treatment.   Lauraine Lakes, CNM 12/05/2024  3:19 PM

## 2024-12-08 ENCOUNTER — Encounter: Payer: MEDICAID | Admitting: Certified Nurse Midwife

## 2024-12-08 ENCOUNTER — Encounter: Payer: Self-pay | Admitting: Certified Nurse Midwife

## 2024-12-08 VITALS — BP 118/74 | HR 99 | Wt 231.2 lb

## 2024-12-08 DIAGNOSIS — Z3480 Encounter for supervision of other normal pregnancy, unspecified trimester: Secondary | ICD-10-CM

## 2024-12-08 NOTE — Assessment & Plan Note (Signed)
 Feeling well overall. Ready for baby. Reviewed labor warning signs and expectations for birth. Instructed to call office or come to hospital with persistent headache, vision changes, regular contractions, leaking of fluid, decreased fetal movement or vaginal bleeding.

## 2024-12-08 NOTE — Progress Notes (Signed)
" ° ° °  Return Prenatal Note   Subjective   23 y.o. G3P0020 at [redacted]w[redacted]d presents for this follow-up prenatal visit.  Patient is doing well today. She has no new concerns today. She reports good fetal movement.  Patient reports: Movement: Present Contractions: Not present  Objective   Flow sheet Vitals: Pulse Rate: 99 BP: 118/74 Total weight gain: 56 lb 3.2 oz (25.5 kg)  General Appearance  No acute distress, well appearing, and well nourished Pulmonary   Normal work of breathing Neurologic   Alert and oriented to person, place, and time Psychiatric   Mood and affect within normal limits   Assessment/Plan   Plan  23 y.o. H6E9979 at [redacted]w[redacted]d presents for follow-up OB visit. Reviewed prenatal record including previous visit note.  Supervision of other normal pregnancy, antepartum Feeling well overall. Ready for baby. Reviewed labor warning signs and expectations for birth. Instructed to call office or come to hospital with persistent headache, vision changes, regular contractions, leaking of fluid, decreased fetal movement or vaginal bleeding.      Future Appointments  Date Time Provider Department Center  12/15/2024  3:35 PM Justino [name] HERO, CNM [facility] None    For next visit:  continue with routine prenatal care  Damien Parsley, CNM [facility] 12/22/20254:22 PM "

## 2024-12-12 ENCOUNTER — Encounter: Payer: Self-pay | Admitting: Registered Nurse

## 2024-12-12 ENCOUNTER — Other Ambulatory Visit: Payer: Self-pay

## 2024-12-12 ENCOUNTER — Observation Stay
Admission: EM | Admit: 2024-12-12 | Discharge: 2024-12-12 | Disposition: A | Discharge status: Home / Self Care | Payer: MEDICAID | Attending: Registered Nurse | Admitting: Registered Nurse

## 2024-12-12 DIAGNOSIS — O471 False labor at or after 37 completed weeks of gestation: Secondary | ICD-10-CM | POA: Diagnosis present

## 2024-12-12 DIAGNOSIS — O26893 Other specified pregnancy related conditions, third trimester: Secondary | ICD-10-CM

## 2024-12-12 DIAGNOSIS — R109 Unspecified abdominal pain: Secondary | ICD-10-CM

## 2024-12-12 DIAGNOSIS — Z3A39 39 weeks gestation of pregnancy: Secondary | ICD-10-CM | POA: Diagnosis not present

## 2024-12-12 DIAGNOSIS — Z3490 Encounter for supervision of normal pregnancy, unspecified, unspecified trimester: Principal | ICD-10-CM

## 2024-12-12 MED ORDER — MORPHINE SULFATE (PF) 10 MG/ML IV SOLN
10.0000 mg | Freq: Once | INTRAVENOUS | Status: AC
  Administered 2024-12-12: 10 mg via INTRAMUSCULAR
  Filled 2024-12-12 (×2): qty 1

## 2024-12-12 MED ORDER — PROMETHAZINE HCL 25 MG/ML IJ SOLN
12.5000 mg | Freq: Once | INTRAMUSCULAR | Status: AC
  Administered 2024-12-12: 12.5 mg via INTRAMUSCULAR
  Filled 2024-12-12 (×2): qty 1

## 2024-12-12 MED ORDER — MORPHINE SULFATE (PF) 10 MG/ML IV SOLN
10.0000 mg | Freq: Once | INTRAVENOUS | Status: DC
  Filled 2024-12-12: qty 1

## 2024-12-12 NOTE — OB Triage Provider Note (Signed)
 LABOR & DELIVERY OB TRIAGE NOTE  SUBJECTIVE  HPI Christine May is a 23 y.o. G3P0020 at [redacted]w[redacted]d who presents to Labor & Delivery for labor evaluation. Contractions began today around 1630 while she was at work. Denies LOF or vaginal bleeding.   OB History     Gravida  3   Para      Term      Preterm      AB  2   Living         SAB  1   IAB  1   Ectopic      Multiple      Live Births             OBJECTIVE  BP 129/63 (BP Location: Left Arm)   Pulse 81   Temp (!) 97.5 F (36.4 C) (Oral)   Resp 18   Ht 5' 8 (1.727 m)   Wt 104.9 kg   LMP 03/12/2024 (Exact Date)   BMI 35.15 kg/m   General: Conversant, breathes through contractions Heart: Well perfused Lungs: Normal WOB Abdomen: Gravid, NT Cervical exam: Dilation: 1 Effacement (%): 50 Station: -2 Presentation: Vertex Exam by:: Botswe, Spendlove RN   NST Baseline: 115 Variability: moderate Accelerations: Present Decelerations: None Toco: q 2-5 mins  ASSESSMENT/ PLAN 1) Pregnancy at H6E9979, [redacted]w[redacted]d, Estimated Date of Delivery: 12/17/24 2) Reassuring maternal/fetal status 3) Prodromal vs very early labor with no cervical change over 2 hours. Discussed options for expectant management vs therapeutic rest. She changed her mind several times, but ultimately opted for morphine  and phenergan  for therapeutic rest. She was monitored for 30 minutes before being discharged home. She will return with more frequent and intense contractions, LOF, or if contractions remain too painful for her to rest. Lauraine Lakes, CNM 12/12/2024  8:36 PM

## 2024-12-12 NOTE — Discharge Instructions (Signed)
 SABRA

## 2024-12-12 NOTE — OB Triage Note (Signed)
 Patient discharged home in stable condition by provider. Pt reports no pain after med admin. Discharge instructions given. Patient verbalized understanding, no questions or concerns. Pt discharged in personal vehicle with support person. Labor precautions given.   Jamiaya Bina L. Javon Snee, RN BSN 12/12/2024 9:42 PM

## 2024-12-12 NOTE — OB Triage Note (Signed)
 23 y.o G3P0 presents to L&D triage at [redacted]w[redacted]d w c/o ct'x that began today around 16:30/17:00. She reports the pain in her anterior abdomen every 2-3 minutes. She denies LOF, vaginal bleeding, and endorses fetal movement. Pt was checked by [facility] RN and showed 1/50/-2. Charma, CNM aware of pt arrival. POC includes labor eval. Initial vitals wnl, fetal monitors applied and assessing.

## 2024-12-15 ENCOUNTER — Ambulatory Visit: Payer: MEDICAID | Admitting: Obstetrics

## 2024-12-15 VITALS — BP 120/71 | HR 95 | Wt 233.0 lb

## 2024-12-15 DIAGNOSIS — Z3483 Encounter for supervision of other normal pregnancy, third trimester: Secondary | ICD-10-CM

## 2024-12-15 DIAGNOSIS — Z3A39 39 weeks gestation of pregnancy: Secondary | ICD-10-CM

## 2024-12-15 DIAGNOSIS — Z3480 Encounter for supervision of other normal pregnancy, unspecified trimester: Secondary | ICD-10-CM

## 2024-12-15 NOTE — Progress Notes (Signed)
" ° ° °  Return Prenatal Note   Assessment/Plan   Plan  23 y.o. G3P0020 at [redacted]w[redacted]d presents for follow-up OB visit. Reviewed prenatal record including previous visit note.  Supervision of other normal pregnancy, antepartum -IOL scheduled for 12/24/24 at [facility] (41 weeks). Instructed to present to the ED at 2330 on 12/23/24 -Discussed IOL procedures and expectations -Reviewed labor warning signs. Instructed to call office or come to hospital with persistent headache, vision changes, regular contractions, leaking of fluid, decreased fetal movement or vaginal bleeding.     No orders of the defined types were placed in this encounter.  No follow-ups on file.   Future Appointments  Date Time Provider Department Center  12/22/2024 10:15 AM AOB-NST ROOM [facility] None  12/22/2024 11:15 AM Slaughterbeck, Damien, CNM [facility] None    For next visit:  ROB with NST    Subjective   Christine May has not had any more contractions since the weekend. She is looking forward to meeting her baby and would like to schedule an induction.   Movement: Present Contractions: Not present  Objective   Flow sheet Vitals: Pulse Rate: 95 BP: 120/71 Total weight gain: 58 lb (26.3 kg)  General Appearance  No acute distress, well appearing, and well nourished Pulmonary   Normal work of breathing Neurologic   Alert and oriented to person, place, and time Psychiatric   Mood and affect within normal limits  Christine May, CNM 12/15/2024 5:57 PM  "

## 2024-12-15 NOTE — Assessment & Plan Note (Signed)
-  IOL scheduled for 12/24/24 at [facility] (41 weeks). Instructed to present to the ED at 2330 on 12/23/24 -Discussed IOL procedures and expectations -Reviewed labor warning signs. Instructed to call office or come to hospital with persistent headache, vision changes, regular contractions, leaking of fluid, decreased fetal movement or vaginal bleeding.

## 2024-12-17 ENCOUNTER — Inpatient Hospital Stay: Payer: MEDICAID | Admitting: Anesthesiology

## 2024-12-17 ENCOUNTER — Encounter: Payer: Self-pay | Admitting: Family Medicine

## 2024-12-17 ENCOUNTER — Other Ambulatory Visit: Payer: Self-pay

## 2024-12-17 ENCOUNTER — Inpatient Hospital Stay
Admission: EM | Admit: 2024-12-17 | Discharge: 2024-12-18 | DRG: 807 | Disposition: A | Discharge status: Home / Self Care | Payer: MEDICAID | Attending: Obstetrics and Gynecology | Admitting: Obstetrics and Gynecology

## 2024-12-17 DIAGNOSIS — K219 Gastro-esophageal reflux disease without esophagitis: Secondary | ICD-10-CM | POA: Diagnosis present

## 2024-12-17 DIAGNOSIS — O480 Post-term pregnancy: Secondary | ICD-10-CM

## 2024-12-17 DIAGNOSIS — Z3A40 40 weeks gestation of pregnancy: Secondary | ICD-10-CM

## 2024-12-17 DIAGNOSIS — Z833 Family history of diabetes mellitus: Secondary | ICD-10-CM

## 2024-12-17 DIAGNOSIS — Z2839 Other underimmunization status: Principal | ICD-10-CM

## 2024-12-17 DIAGNOSIS — O9962 Diseases of the digestive system complicating childbirth: Secondary | ICD-10-CM | POA: Diagnosis present

## 2024-12-17 DIAGNOSIS — Z3490 Encounter for supervision of normal pregnancy, unspecified, unspecified trimester: Principal | ICD-10-CM

## 2024-12-17 DIAGNOSIS — O26893 Other specified pregnancy related conditions, third trimester: Secondary | ICD-10-CM | POA: Diagnosis present

## 2024-12-17 DIAGNOSIS — O9902 Anemia complicating childbirth: Secondary | ICD-10-CM | POA: Diagnosis present

## 2024-12-17 LAB — CBC
HCT: 33.8 % — ABNORMAL LOW (ref 36.0–46.0)
Hemoglobin: 11.3 g/dL — ABNORMAL LOW (ref 12.0–15.0)
MCH: 31.4 pg (ref 26.0–34.0)
MCHC: 33.4 g/dL (ref 30.0–36.0)
MCV: 93.9 fL (ref 80.0–100.0)
Platelets: 257 K/uL (ref 150–400)
RBC: 3.6 MIL/uL — ABNORMAL LOW (ref 3.87–5.11)
RDW: 13.3 % (ref 11.5–15.5)
WBC: 15.4 K/uL — ABNORMAL HIGH (ref 4.0–10.5)
nRBC: 0 % (ref 0.0–0.2)

## 2024-12-17 LAB — TYPE AND SCREEN
ABO/RH(D): A POS
Antibody Screen: NEGATIVE

## 2024-12-17 LAB — SYPHILIS: RPR W/REFLEX TO RPR TITER AND TREPONEMAL ANTIBODIES, TRADITIONAL SCREENING AND DIAGNOSIS ALGORITHM: RPR Ser Ql: NONREACTIVE

## 2024-12-17 LAB — ABO/RH: ABO/RH(D): A POS

## 2024-12-17 MED ORDER — TERBUTALINE SULFATE 1 MG/ML IJ SOLN: 0.2500 mg | Freq: Once | INTRAMUSCULAR | Status: DC | PRN

## 2024-12-17 MED ORDER — OXYTOCIN-SODIUM CHLORIDE 30-0.9 UT/500ML-% IV SOLN: 2.5000 [IU]/h | INTRAVENOUS | Status: DC | PRN

## 2024-12-17 MED ORDER — LACTATED RINGERS IV SOLN: INTRAVENOUS | Status: DC

## 2024-12-17 MED ORDER — VARICELLA VIRUS VACCINE LIVE 1350 PFU/0.5ML IJ SUSR
0.5000 mL | Freq: Once | INTRAMUSCULAR | Status: AC
  Administered 2024-12-18: 0.5 mL via SUBCUTANEOUS
  Filled 2024-12-17 (×2): qty 0.5

## 2024-12-17 MED ORDER — HYDROXYZINE HCL 25 MG PO TABS: 50.0000 mg | ORAL_TABLET | Freq: Four times a day (QID) | ORAL | Status: DC | PRN

## 2024-12-17 MED ORDER — ONDANSETRON HCL 4 MG/2ML IJ SOLN: 4.0000 mg | INTRAMUSCULAR | Status: DC | PRN

## 2024-12-17 MED ORDER — BENZOCAINE-MENTHOL 20-0.5 % EX AERO
1.0000 | INHALATION_SPRAY | CUTANEOUS | Status: DC | PRN
  Administered 2024-12-18: 1 via TOPICAL
  Filled 2024-12-17 (×2): qty 56

## 2024-12-17 MED ORDER — OXYTOCIN 10 UNIT/ML IJ SOLN
INTRAMUSCULAR | Status: AC
  Filled 2024-12-17: qty 2

## 2024-12-17 MED ORDER — SIMETHICONE 80 MG PO CHEW: 80.0000 mg | CHEWABLE_TABLET | ORAL | Status: DC | PRN

## 2024-12-17 MED ORDER — MISOPROSTOL 50MCG HALF TABLET: 50.0000 ug | ORAL_TABLET | Freq: Once | ORAL | Status: DC

## 2024-12-17 MED ORDER — DIPHENHYDRAMINE HCL 50 MG/ML IJ SOLN: 12.5000 mg | INTRAMUSCULAR | Status: DC | PRN

## 2024-12-17 MED ORDER — MISOPROSTOL 25 MCG QUARTER TABLET: 25.0000 ug | ORAL_TABLET | Freq: Once | ORAL | Status: DC

## 2024-12-17 MED ORDER — METHYLERGONOVINE MALEATE 0.2 MG PO TABS: 0.2000 mg | ORAL_TABLET | ORAL | Status: DC | PRN

## 2024-12-17 MED ORDER — EPHEDRINE 5 MG/ML INJ: 10.0000 mg | INTRAVENOUS | Status: DC | PRN

## 2024-12-17 MED ORDER — COCONUT OIL OIL
1.0000 | TOPICAL_OIL | Status: DC | PRN
  Filled 2024-12-17: qty 7.5

## 2024-12-17 MED ORDER — PHENYLEPHRINE 80 MCG/ML (10ML) SYRINGE FOR IV PUSH (FOR BLOOD PRESSURE SUPPORT): 80.0000 ug | PREFILLED_SYRINGE | INTRAVENOUS | Status: DC | PRN

## 2024-12-17 MED ORDER — TRANEXAMIC ACID-NACL 1000-0.7 MG/100ML-% IV SOLN
1000.0000 mg | INTRAVENOUS | Status: AC
  Administered 2024-12-17: 1000 mg via INTRAVENOUS

## 2024-12-17 MED ORDER — CALCIUM CARBONATE ANTACID 500 MG PO CHEW: 2.0000 | CHEWABLE_TABLET | ORAL | Status: DC | PRN

## 2024-12-17 MED ORDER — AMMONIA AROMATIC IN INHA
RESPIRATORY_TRACT | Status: AC
  Filled 2024-12-17: qty 10

## 2024-12-17 MED ORDER — LACTATED RINGERS IV SOLN: 500.0000 mL | Freq: Once | INTRAVENOUS | Status: DC

## 2024-12-17 MED ORDER — LIDOCAINE HCL (PF) 1 % IJ SOLN
INTRAMUSCULAR | Status: AC
  Filled 2024-12-17: qty 30

## 2024-12-17 MED ORDER — PRENATAL MULTIVITAMIN CH
1.0000 | ORAL_TABLET | Freq: Every day | ORAL | Status: DC
  Administered 2024-12-18: 1 via ORAL
  Filled 2024-12-17: qty 1

## 2024-12-17 MED ORDER — OXYTOCIN-SODIUM CHLORIDE 30-0.9 UT/500ML-% IV SOLN: 1.0000 m[IU]/min | INTRAVENOUS | Status: DC

## 2024-12-17 MED ORDER — OXYTOCIN-SODIUM CHLORIDE 30-0.9 UT/500ML-% IV SOLN
2.5000 [IU]/h | INTRAVENOUS | Status: DC
  Administered 2024-12-17: 2.5 [IU]/h via INTRAVENOUS
  Filled 2024-12-17: qty 500

## 2024-12-17 MED ORDER — LACTATED RINGERS IV SOLN: 500.0000 mL | INTRAVENOUS | Status: DC | PRN

## 2024-12-17 MED ORDER — SOD CITRATE-CITRIC ACID 500-334 MG/5ML PO SOLN: 30.0000 mL | ORAL | Status: DC | PRN

## 2024-12-17 MED ORDER — DIPHENHYDRAMINE HCL 25 MG PO CAPS: 25.0000 mg | ORAL_CAPSULE | Freq: Four times a day (QID) | ORAL | Status: DC | PRN

## 2024-12-17 MED ORDER — EPHEDRINE 5 MG/ML INJ
10.0000 mg | INTRAVENOUS | Status: DC | PRN
  Filled 2024-12-17: qty 5

## 2024-12-17 MED ORDER — LIDOCAINE-EPINEPHRINE (PF) 1.5 %-1:200000 IJ SOLN
INTRAMUSCULAR | Status: DC | PRN
  Administered 2024-12-17: 3 mL via EPIDURAL

## 2024-12-17 MED ORDER — MISOPROSTOL 25 MCG QUARTER TABLET: 25.0000 ug | ORAL_TABLET | ORAL | Status: DC | PRN

## 2024-12-17 MED ORDER — OXYCODONE-ACETAMINOPHEN 5-325 MG PO TABS: 1.0000 | ORAL_TABLET | ORAL | Status: DC | PRN

## 2024-12-17 MED ORDER — ACETAMINOPHEN 325 MG PO TABS: 650.0000 mg | ORAL_TABLET | ORAL | Status: DC | PRN

## 2024-12-17 MED ORDER — MISOPROSTOL 200 MCG PO TABS
ORAL_TABLET | ORAL | Status: AC
  Filled 2024-12-17: qty 4

## 2024-12-17 MED ORDER — METHYLERGONOVINE MALEATE 0.2 MG/ML IJ SOLN: 0.2000 mg | INTRAMUSCULAR | Status: DC | PRN

## 2024-12-17 MED ORDER — ONDANSETRON HCL 4 MG/2ML IJ SOLN
4.0000 mg | Freq: Four times a day (QID) | INTRAMUSCULAR | Status: DC | PRN
  Filled 2024-12-17: qty 2

## 2024-12-17 MED ORDER — ZOLPIDEM TARTRATE 5 MG PO TABS: 5.0000 mg | ORAL_TABLET | Freq: Every evening | ORAL | Status: DC | PRN

## 2024-12-17 MED ORDER — FENTANYL-BUPIVACAINE-NACL 0.5-0.125-0.9 MG/250ML-% EP SOLN
12.0000 mL/h | EPIDURAL | Status: DC | PRN
  Administered 2024-12-17: 12 mL/h via EPIDURAL
  Filled 2024-12-17: qty 250

## 2024-12-17 MED ORDER — FENTANYL CITRATE (PF) 100 MCG/2ML IJ SOLN: 50.0000 ug | INTRAMUSCULAR | Status: DC | PRN

## 2024-12-17 MED ORDER — OXYTOCIN BOLUS FROM INFUSION
333.0000 mL | Freq: Once | INTRAVENOUS | Status: AC
  Administered 2024-12-17: 333 mL via INTRAVENOUS

## 2024-12-17 MED ORDER — LIDOCAINE HCL (PF) 1 % IJ SOLN
INTRAMUSCULAR | Status: DC | PRN
  Administered 2024-12-17: 3 mL via SUBCUTANEOUS

## 2024-12-17 MED ORDER — IBUPROFEN 600 MG PO TABS
600.0000 mg | ORAL_TABLET | Freq: Four times a day (QID) | ORAL | Status: DC
  Administered 2024-12-17 – 2024-12-18 (×5): 600 mg via ORAL
  Filled 2024-12-17 (×6): qty 1

## 2024-12-17 MED ORDER — LIDOCAINE HCL (PF) 1 % IJ SOLN: 30.0000 mL | INTRAMUSCULAR | Status: DC | PRN

## 2024-12-17 MED ORDER — DOCUSATE SODIUM 100 MG PO CAPS
100.0000 mg | ORAL_CAPSULE | Freq: Two times a day (BID) | ORAL | Status: DC
  Administered 2024-12-17 – 2024-12-18 (×2): 100 mg via ORAL
  Filled 2024-12-17 (×2): qty 1

## 2024-12-17 MED ORDER — SODIUM CHLORIDE 0.9 % IV SOLN
INTRAVENOUS | Status: DC | PRN
  Administered 2024-12-17 (×2): 5 mL via EPIDURAL

## 2024-12-17 MED ORDER — ACETAMINOPHEN 325 MG PO TABS
650.0000 mg | ORAL_TABLET | ORAL | Status: DC | PRN
  Administered 2024-12-17: 650 mg via ORAL
  Filled 2024-12-17: qty 2

## 2024-12-17 MED ORDER — ONDANSETRON HCL 4 MG PO TABS: 4.0000 mg | ORAL_TABLET | ORAL | Status: DC | PRN

## 2024-12-17 NOTE — Discharge Summary (Signed)
 "    Postpartum Discharge Summary  Date of Service updated***     Patient Name: Christine May DOB: 03-Mar-2001 MRN: [id number]  Date of admission: 12/17/2024 Delivery date:12/17/2024 Delivering provider: JUSTINO [name] HERO Date of discharge: 12/17/2024  Admitting diagnosis: Labor and delivery, indication for care [O75.9] Intrauterine pregnancy: [redacted]w[redacted]d     Secondary diagnosis:  Active Problems:   Labor and delivery, indication for care  Additional problems: none    Discharge diagnosis: Term Pregnancy Delivered                                              Post partum procedures:{Postpartum procedures:23558} Augmentation: AROM Complications: None  Hospital course: Onset of Labor With Vaginal Delivery      23 y.o. yo [id number] at [redacted]w[redacted]d was admitted in Latent Labor on 12/17/2024. Labor course was uncomplicated   Membrane Rupture Time/Date: 12:58 PM,12/17/2024  Delivery Method:Vaginal, Spontaneous Operative Delivery:N/A Episiotomy: None Lacerations:  2nd degree;Labial;Vaginal Patient had an uncomplicated postpartum course.  She is ambulating, tolerating a regular diet, passing flatus, and urinating well. Patient is discharged home in stable condition on 12/17/2024.  Newborn Data: Birth date:12/17/2024 Birth time:4:21 PM Gender:Female Living status:Living Apgars:8 ,9  Weight:3860 g  Magnesium  Sulfate received: No BMZ received: No Rhophylac:N/A MMR:{MMR:30440033} T-DaP:Given prenatally Flu: Yes RSV Vaccine received: Yes Transfusion:{Transfusion received:30440034} Immunizations administered: Immunization History  Administered Date(s) Administered    sv, Bivalent, Protein Subunit Rsvpref,pf Marlow) 10/27/2024   Influenza, Seasonal, Injecte, Preservative Fre 10/27/2024   Influenza,inj,Quad PF,6+ Mos 09/20/2018   Tdap 09/29/2024    Physical exam  Vitals:   12/17/24 1644 12/17/24 1649 12/17/24 1654 12/17/24 1658  BP:    99/64  Pulse:    (!) 101  Resp:      Temp:       TempSrc:      SpO2: 99% 100% 100% 100%  Weight:      Height:       General: {Exam; general:21111117} Lochia: {Desc; appropriate/inappropriate:30686::appropriate} Uterine Fundus: {Desc; firm/soft:30687} Incision: {Exam; incision:21111123} DVT Evaluation: {Exam; dvt:2111122} Labs: Lab Results  Component Value Date   WBC 15.4 (H) 12/17/2024   HGB 11.3 (L) 12/17/2024   HCT 33.8 (L) 12/17/2024   MCV 93.9 12/17/2024   PLT 257 12/17/2024      Latest Ref Rng & Units 02/25/2019    7:40 PM  CMP  Glucose 70 - 99 mg/dL 92   BUN 6 - 20 mg/dL 9   Creatinine 9.55 - 8.99 mg/dL 9.45   Sodium 864 - 854 mmol/L 140   Potassium 3.5 - 5.1 mmol/L 3.5   Chloride 98 - 111 mmol/L 107   CO2 22 - 32 mmol/L 24   Calcium 8.9 - 10.3 mg/dL 9.2   Total Protein 6.5 - 8.1 g/dL 7.3   Total Bilirubin 0.3 - 1.2 mg/dL 0.6   Alkaline Phos 38 - 126 U/L 37   AST 15 - 41 U/L 16   ALT 0 - 44 U/L 13    Edinburgh Score:    05/09/2024    2:30 PM  Edinburgh Postnatal Depression Scale Screening Tool  I have been able to laugh and see the funny side of things. 0   I have looked forward with enjoyment to things. 0   I have blamed myself unnecessarily when things went wrong. 3   I have been anxious or worried for no  good reason. 2   I have felt scared or panicky for no good reason. 2   Things have been getting on top of me. 1   I have been so unhappy that I have had difficulty sleeping. 2   I have felt sad or miserable. 2   I have been so unhappy that I have been crying. 1   The thought of harming myself has occurred to me. 2   Edinburgh Postnatal Depression Scale Total 15      Data saved with a previous flowsheet row definition      After visit meds:  Allergies as of 12/17/2024   No Known Allergies   Med Rec must be completed prior to using this [facility]***        Discharge home in stable condition Infant Feeding: {Baby feeding:23562} Infant Disposition:{CHL IP OB HOME WITH  FNUYZM:76418} Discharge instruction: per After Visit Summary and Postpartum booklet. Activity: Advance as tolerated. Pelvic rest for 6 weeks.  Diet: {OB diet:21111121} Anticipated Birth Control: {Birth Control:23956} Postpartum Appointment:{Outpatient follow up:23559} Additional Postpartum F/U: {PP Procedure:23957} Future Appointments: Future Appointments  Date Time Provider Department Center  12/22/2024 10:15 AM [facility] ROOM [facility] None  12/22/2024 11:15 AM Slaughterbeck, Damien, CNM [facility] None   Follow up Visit:      12/17/2024 [name] CHRISTELLA Canny, CNM   "

## 2024-12-17 NOTE — H&P (Signed)
 [facility] Labor & Delivery  History and Physical  ASSESSMENT AND PLAN   Christine May is a 23 y.o. G3P0020 at [redacted]w[redacted]d with EDD: 12/17/2024, by Last Menstrual Period admitted for labor.    Fetal Status: - cephalic presentation by Leopolds and by BSUS at 36 wk.  - EFW: 7# by Leopolds - Continuous fetal monitoring - FHT currently cat 1  Depression/Anxiety/Bipolar disorder -No meds  Labs/Immunizations: Prenatal labs and studies: ABO, Rh: A/Positive/-- (06/19 1116) Antibody: Negative (06/19 1116) Rubella: 4.00 (06/19 1116) Varicella: Non Reactive (06/19 1116)  RPR: Non Reactive (10/13 1057)  HBsAg: Negative (06/19 1116)  HepC: Non Reactive (06/19 1116) HIV: Non Reactive (10/13 1057)  GBS: Negative/-- (12/09 1030)   TDAP: given prenatally Flu: given prenatally RSV: given prenatally  Postpartum Plan: - Feeding: Breast Milk - Contraception: plans Progesterone-only OCPs - Prenatal Care Provider: AOB     HPI   Chief Complaint: Contractions  Christine May is a 23 y.o. G3P0020 at [redacted]w[redacted]d who presents for contractions that became intense around 1900 last night. She was seen in triage 5 days ago and was given therapeutic rest.    Patient denies headache, vision changes, RUQ pain, LOF or bleeding.  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   HISTORY   Medications Medications Prior to Admission  Medication Sig Dispense Refill Last  Dose/Taking   Prenatal Vit-Fe Fumarate-FA (PRENATAL PO) Take by mouth.   12/16/2024   acetaminophen  (TYLENOL ) 325 MG tablet Take 2 tablets (650 mg total) by mouth every 4 (four) hours as needed for mild pain (pain score 1-3) or moderate pain (pain score 4-6) (for pain scale < 4  OR  temperature  >/=  100.5 F). 30 tablet 0     Allergies has no known allergies.   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 She has never used  smokeless tobacco. She reports that she does not currently use alcohol. She reports that she does not currently use drugs after having used the following drugs: Marijuana. Frequency: 14.00 times per week.   Family History family history includes Diabetes in her paternal grandfather; Epilepsy in her father; Restless legs syndrome in her mother; Skin cancer in her maternal grandmother and mother; Strabismus in her mother.   PHYSICAL EXAM   Vitals:   12/17/24 0100  BP: 123/70  Pulse: 84  Resp: 17  Temp: 97.9 F (36.6 C)  TempSrc: Oral    Constitutional: No acute distress, well appearing, and well nourished. Neurologic: She  is alert and conversational.  Psychiatric: She has a normal mood and affect.  Musculoskeletal: Normal gait, grossly normal range of motion Cardiovascular: Normal rate.   Pulmonary/Chest: Normal work of breathing.  Gastrointestinal/Abdominal: Soft. Gravid. There is no tenderness.  Skin: Skin is warm and dry. No rash noted.  Genitourinary: Normal external female genitalia.  SVE:   Dilation: 4 Effacement (%): 80 Station: -1 Presentation: Vertex Exam by:: D Burns, RN   NST Interpretation Indication: Labor Baseline: 125 bpm Variability: moderate Accelerations: present Decelerations: none Contractions: regular, every 2-4 minutes Time noted:  See OBIX Impression: reactive Authenticated by: Damien PARSLEY   Attending Dr. Fredirick was immediately available for the care of the patient.   Damien PARSLEY, CNM

## 2024-12-17 NOTE — Progress Notes (Signed)
 Labor Progress Note   ASSESSMENT/PLAN   Christine May 23 y.o.   [id number]  at [redacted]w[redacted]d here for labor.  FWB:  - Fetal well being assessed: Category 2        GBS: - GBS negative  LABOR: - Active labor, doing well. - Cervical lip on maternal left with BBOW. Discussed risks and benefits of AROM with Christine May and her partner. AROM performed for light mec. - Discussed options with patient and  - Pain Management: Epidural - Anticipate SVD   Labor Progress Cervical Exam  Last 3 exams Dil Eff Sta Exam Time  9.5 100 -- 1257  7 90 -1 1000  6 90 -1 0631    Active Problems:   Labor and delivery, indication for care   SUBJECTIVE/OBJECTIVE   SUBJECTIVE:  Christine May is still resting comfortably.   OBJECTIVE: Vital Signs: Patient Vitals for the past 12 hrs:  BP Temp Temp src Pulse SpO2 Height Weight  12/17/24 1055 (!) 110/52 -- -- -- -- -- --  12/17/24 1051 (!) 97/47 -- -- -- -- -- --  12/17/24 1038 (!) 91/44 -- -- -- -- -- --  12/17/24 0938 (!) 104/55 -- -- -- -- -- --  12/17/24 0838 (!) 97/52 -- -- -- -- -- --  12/17/24 0823 (!) 99/55 -- -- -- -- -- --  12/17/24 0808 105/69 98.2 F (36.8 C) Oral 85 -- -- --  12/17/24 0649 -- -- -- -- 98 % -- --  12/17/24 0644 -- -- -- -- 100 % -- --  12/17/24 0639 -- -- -- -- 100 % -- --  12/17/24 0636 -- -- -- -- 99 % -- --  12/17/24 0634 -- -- -- -- 99 % -- --  12/17/24 0631 114/62 -- -- 90 99 % -- --  12/17/24 0629 114/62 -- -- 90 99 % -- --  12/17/24 0624 113/79 -- -- 87 98 % -- --  12/17/24 0619 115/67 -- -- 78 99 % -- --  12/17/24 0614 113/63 -- -- 79 99 % -- --  12/17/24 0610 118/66 -- -- 74 100 % -- --  12/17/24 0607 (!) 113/58 -- -- 81 -- -- --  12/17/24 0604 -- -- -- -- 99 % -- --  12/17/24 0559 -- -- -- -- 100 % -- --  12/17/24 0554 -- -- -- -- 100 % -- --  12/17/24 0549 -- -- -- -- 97 % -- --  12/17/24 0544 -- -- -- -- 97 % -- --  12/17/24 0539 -- -- -- -- 98 % -- --  12/17/24 0534 -- -- -- -- 97 % -- --  12/17/24 0529 -- -- --  -- 99 % -- --  12/17/24 0524 -- -- -- -- 99 % -- --  12/17/24 0519 -- -- -- -- 99 % -- --  12/17/24 0514 -- -- -- -- 99 % -- --  12/17/24 0509 -- -- -- -- 99 % -- --  12/17/24 0454 -- -- -- -- -- 5' 8 (1.727 m) 105.7 kg    Last SVE:  Dilation: Lip/rim Effacement (%): 100 Cervical Position: Middle Station: -1 Presentation: Vertex Exam by:: m Justino -  , Rupture Date: 12/17/24, Rupture Time: 1258,    FHR:   - Baseline: 135 - Variability:  min/mod - Accels: present - Decels: variable Category 2  UTERINE ACTIVITY:  Contractions: q 3-4 minutes  [name] CHRISTELLA Justino, CNM

## 2024-12-17 NOTE — Progress Notes (Deleted)
" ° ° °  Return Prenatal Note   Subjective   23 y.o. G3P0020 at [redacted]w[redacted]d presents for this follow-up prenatal visit.  Patient *** Patient reports:    Objective   Flow sheet Vitals:   Total weight gain: 58 lb (26.3 kg)  General Appearance  No acute distress, well appearing, and well nourished Pulmonary   Normal work of breathing Neurologic   Alert and oriented to person, place, and time Psychiatric   Mood and affect within normal limits   Assessment/Plan   Plan  23 y.o. G3P0020 at [redacted]w[redacted]d presents for follow-up OB visit. Reviewed prenatal record including previous visit note.  No problem-specific Assessment & Plan notes found for this encounter.      No orders of the defined types were placed in this encounter.  No follow-ups on file.   Future Appointments  Date Time Provider Department Center  12/22/2024 10:15 AM AOB-[facility]ST ROOM [facility] None  12/22/2024 11:15 AM Slaughterbeck, Damien, CNM [facility] None    For next visit:  continue with routine prenatal care     Camelia LITTIE Fetters, CMA  12/31/202511:13 AM "

## 2024-12-17 NOTE — OB Triage Note (Signed)
 Christine May 23 y.o. @40w  G3P0 presents to Labor & Delivery triage via wheelchair steered by ED staff reporting contractions that became more intense around 1900 every 2-4 minutes. She denies signs and symptoms consistent with rupture of membranes or active vaginal bleeding. She states positive fetal movement. External FM and TOCO applied to non-tender abdomen. Initial FHR 125. Vital signs obtained and within normal limits. Patient oriented to care environment including call bell and bed control use. Slaughterbeck, CNM notified of patient's arrival. Plan to 2 hour labor eval. Franklin Clapsaddle L. Kennede Lusk, RN BSN 12/17/2024 1:23 AM

## 2024-12-17 NOTE — Anesthesia Preprocedure Evaluation (Signed)
"                                    Anesthesia Evaluation  Patient identified by MRN, date of birth, ID band Patient awake    Reviewed: Allergy & Precautions, H&P , NPO status , Patient's Chart, lab work & pertinent test results, reviewed documented beta blocker date and time   History of Anesthesia Complications Negative for: history of anesthetic complications  Airway Mallampati: III  TM Distance: >3 FB Neck ROM: full    Dental no notable dental hx.    Pulmonary neg shortness of breath, asthma (as a child) , neg recent URI   Pulmonary exam normal breath sounds clear to auscultation       Cardiovascular Exercise Tolerance: Good negative cardio ROS Normal cardiovascular exam Rhythm:regular Rate:Normal     Neuro/Psych  PSYCHIATRIC DISORDERS Anxiety Depression Bipolar Disorder   negative neurological ROS     GI/Hepatic Neg liver ROS,GERD  Poorly Controlled,,  Endo/Other  negative endocrine ROS    Renal/GU negative Renal ROS  negative genitourinary   Musculoskeletal   Abdominal   Peds  Hematology  (+) Blood dyscrasia, anemia   Anesthesia Other Findings Past Medical History: No date: ADHD (attention deficit hyperactivity disorder) 11/07/2015: Anxiety disorder of adolescence No date: Bipolar 1 disorder (HCC) 2014: Depression No date: Duanes syndrome No date: Headache No date: Severe episode of recurrent major depressive disorder,  without psychotic features (HCC) No date: Severe single current episode of major depressive disorder,  without psychotic features (HCC)   Reproductive/Obstetrics (+) Pregnancy                              Anesthesia Physical Anesthesia Plan  ASA: 2  Anesthesia Plan: Epidural   Post-op Pain Management:    Induction:   PONV Risk Score and Plan:   Airway Management Planned:   Additional Equipment:   Intra-op Plan:   Post-operative Plan:   Informed Consent: I have reviewed the  patients History and Physical, chart, labs and discussed the procedure including the risks, benefits and alternatives for the proposed anesthesia with the patient or authorized representative who has indicated his/her understanding and acceptance.     Dental Advisory Given  Plan Discussed with: Anesthesiologist, CRNA and Surgeon  Anesthesia Plan Comments:          Anesthesia Quick Evaluation  "

## 2024-12-17 NOTE — Anesthesia Procedure Notes (Signed)
 Epidural Patient location during procedure: OB Start time: 12/17/2024 5:55 AM End time: 12/17/2024 5:59 AM  Staffing Anesthesiologist: Dario Barter, MD Performed: anesthesiologist   Preanesthetic Checklist Completed: patient identified, IV checked, site marked, risks and benefits discussed, surgical consent, monitors and equipment checked, pre-op evaluation and timeout performed  Epidural Patient position: sitting Prep: ChloraPrep Patient monitoring: heart rate, continuous pulse ox and blood pressure Approach: midline Location: L3-L4 Injection technique: LOR saline  Needle:  Needle type: Tuohy  Needle gauge: 17 G Needle length: 9 cm Needle insertion depth: 5 cm Catheter type: closed end flexible Catheter size: 19 Gauge Catheter at skin depth: 10 cm Test dose: negative and 1.5% lidocaine with Epi 1:200 K  Assessment Sensory level: T10 Events: blood not aspirated, no cerebrospinal fluid, injection not painful, no injection resistance, no paresthesia and negative IV test  Additional Notes 1st attempt Pt. Evaluated and documentation done after procedure finished. Patient identified. Risks/Benefits/Options discussed with patient including but not limited to bleeding, infection, nerve damage, paralysis, failed block, incomplete pain control, headache, blood pressure changes, nausea, vomiting, reactions to medication both or allergic, itching and postpartum back pain. Confirmed with bedside nurse the patient's most recent platelet count. Confirmed with patient that they are not currently taking any anticoagulation, have any bleeding history or any family history of bleeding disorders. Patient expressed understanding and wished to proceed. All questions were answered. Sterile technique was used throughout the entire procedure. Please see nursing notes for vital signs. Test dose was given through epidural catheter and negative prior to continuing to dose epidural or start infusion.  Warning signs of high block given to the patient including shortness of breath, tingling/numbness in hands, complete motor block, or any concerning symptoms with instructions to call for help. Patient was given instructions on fall risk and not to get out of bed. All questions and concerns addressed with instructions to call with any issues or inadequate analgesia.    Patient tolerated the insertion well without immediate complications.Reason for block:procedure for pain

## 2024-12-17 NOTE — Progress Notes (Signed)
 Labor Progress Note   ASSESSMENT/PLAN   Christine May 23 y.o.   [id number]  at [redacted]w[redacted]d here for labor.  FWB:  - Fetal well being assessed: Category 1        GBS: - GBS negative  LABOR: -  Active labor, doing well. - Expectant management. Consider AROM if indicated. - Pain Management: Epidural - Anticipate SVD   Labor Progress Cervical Exam  Last 3 exams Dil Eff Sta Exam Time  7, BBOW 90 -1 1000  6 90 -1 0631  4.5 80 -1 0440    Active Problems:   Labor and delivery, indication for care   SUBJECTIVE/OBJECTIVE   SUBJECTIVE:  Christine May is comfortable with her epidural and has been sleeping.   OBJECTIVE: Vital Signs: Patient Vitals for the past 12 hrs:  BP Temp Temp src Pulse Resp SpO2 Height Weight  12/17/24 0808 105/69 98.2 F (36.8 C) Oral 85 -- -- -- --  12/17/24 0649 -- -- -- -- -- 98 % -- --  12/17/24 0644 -- -- -- -- -- 100 % -- --  12/17/24 0639 -- -- -- -- -- 100 % -- --  12/17/24 0636 -- -- -- -- -- 99 % -- --  12/17/24 0634 -- -- -- -- -- 99 % -- --  12/17/24 0631 114/62 -- -- 90 -- 99 % -- --  12/17/24 0629 114/62 -- -- 90 -- 99 % -- --  12/17/24 0624 113/79 -- -- 87 -- 98 % -- --  12/17/24 0619 115/67 -- -- 78 -- 99 % -- --  12/17/24 0614 113/63 -- -- 79 -- 99 % -- --  12/17/24 0610 118/66 -- -- 74 -- 100 % -- --  12/17/24 0607 (!) 113/58 -- -- 81 -- -- -- --  12/17/24 0604 -- -- -- -- -- 99 % -- --  12/17/24 0559 -- -- -- -- -- 100 % -- --  12/17/24 0554 -- -- -- -- -- 100 % -- --  12/17/24 0549 -- -- -- -- -- 97 % -- --  12/17/24 0544 -- -- -- -- -- 97 % -- --  12/17/24 0539 -- -- -- -- -- 98 % -- --  12/17/24 0534 -- -- -- -- -- 97 % -- --  12/17/24 0529 -- -- -- -- -- 99 % -- --  12/17/24 0524 -- -- -- -- -- 99 % -- --  12/17/24 0519 -- -- -- -- -- 99 % -- --  12/17/24 0514 -- -- -- -- -- 99 % -- --  12/17/24 0509 -- -- -- -- -- 99 % -- --  12/17/24 0454 -- -- -- -- -- -- 5' 8 (1.727 m) 105.7 kg  12/17/24 0100 123/70 97.9 F (36.6 C) Oral 84  17 -- -- --    Last SVE:  Dilation: 7 Effacement (%): 90 Cervical Position: Middle Station: -1 Presentation: Vertex Exam by:: EMERSON May, CNM -  ,  ,  ,    FHR:   - Baseline: 130 - Variability: moderate - Accels: present - Decels: none Category 1  UTERINE ACTIVITY:  Contractions: q 2-4  Christine May Christine May, CNM

## 2024-12-18 LAB — CBC
HCT: 30.8 % — ABNORMAL LOW (ref 36.0–46.0)
Hemoglobin: 10.3 g/dL — ABNORMAL LOW (ref 12.0–15.0)
MCH: 31.7 pg (ref 26.0–34.0)
MCHC: 33.4 g/dL (ref 30.0–36.0)
MCV: 94.8 fL (ref 80.0–100.0)
Platelets: 229 K/uL (ref 150–400)
RBC: 3.25 MIL/uL — ABNORMAL LOW (ref 3.87–5.11)
RDW: 13.6 % (ref 11.5–15.5)
WBC: 14.5 K/uL — ABNORMAL HIGH (ref 4.0–10.5)
nRBC: 0 % (ref 0.0–0.2)

## 2024-12-18 MED ORDER — ACETAMINOPHEN 500 MG PO TABS: 1000.0000 mg | ORAL_TABLET | Freq: Four times a day (QID) | ORAL | 1 refills | Status: DC | PRN

## 2024-12-18 MED ORDER — COCONUT OIL OIL: 1.0000 | TOPICAL_OIL | Status: DC | PRN

## 2024-12-18 MED ORDER — DOCUSATE SODIUM 100 MG PO CAPS: 100.0000 mg | ORAL_CAPSULE | Freq: Two times a day (BID) | ORAL | 0 refills | Status: DC

## 2024-12-18 MED ORDER — BENZOCAINE-MENTHOL 20-0.5 % EX AERO: 1.0000 | INHALATION_SPRAY | CUTANEOUS | 1 refills | Status: DC | PRN

## 2024-12-18 MED ORDER — IBUPROFEN 600 MG PO TABS: 600.0000 mg | ORAL_TABLET | Freq: Four times a day (QID) | ORAL | 1 refills | Status: AC

## 2024-12-18 MED ORDER — DIBUCAINE (PERIANAL) 1 % EX OINT: 1.0000 | TOPICAL_OINTMENT | CUTANEOUS | 1 refills | Status: DC | PRN

## 2024-12-18 NOTE — Clinical Social Work Maternal (Signed)
 " CLINICAL SOCIAL WORK MATERNAL/CHILD NOTE  Patient Details  Name: Christine May MRN: [id number] Date of Birth: [birth year]  Date:  12/18/2024  Clinical Social Worker Initiating Note:  Emerick Weatherly Date/Time: Initiated:  12/18/24/1115     Child's Name:  Christine May   Biological Parents:  Mother   Need for Interpreter:  None   Reason for Referral:  Current Substance Use/Substance Use During Pregnancy     Address:  [address] [place] [address]    Phone number:  [phone] (home)     Additional phone number:   Household Members/Support Persons (HM/SP):   Household Member/Support Person 1, Household Member/Support Person 2, Household Member/Support Person 3, Household Member/Support Person 4   HM/SP Name Relationship DOB or Age  HM/SP -1 Presenter, Broadcasting Father of the patient    HM/SP -2 Barista Mother of the patient    HM/SP -3 [name] Surveyor, Minerals (mom of the baby father)    HM/SP -4 [name] Father of the baby    HM/SP -5        HM/SP -6        HM/SP -7        HM/SP -8          Natural Supports (not living in the home):  Friends   Professional Supports:     Employment: Environmental Education Officer   Type of Work: Statistician   Education:  9 to 11 years   Homebound arranged: No  Financial Resources:  [employer]   Other Resources:  [employer]   Cultural/Religious Considerations Which May Impact Care:    Strengths:  Ability to meet basic needs  , Compliance with medical plan  , Home prepared for child  , Pediatrician chosen   Psychotropic Medications:         Pediatrician:    [employer]  Pediatrician List:   [facility]    [place] [facility]    [facility]  [facility]      Pediatrician Fax Number:    Risk Factors/Current Problems:  Substance Use     Cognitive State:  Alert     Mood/Affect:  Calm  , Happy  , Interested     CSW Assessment:  Chart reviewed. I received a consult that  the patient tested positive for THC during pregnancy. I was able to speak with the patient at bedside. The patient mother in law was in the room. I introduced myself, my role, reason for consult, and HIPAA. The patient consented to mother in law staying in the room.   The patient reports that she was shocked after delivery. The patient confirmed that are address is [address] [place] [address] and telephone number is [phone].  The patient reports that she has support from her friends outside of the home. The patient reports that she lives with the FOB, her parents and mother in law. The patient reports that her highest level of education is 9th grade and she works at [employer].   The patient reports that she receives [facility] but makes to much for Food stamps. The patient reports that she has a mental health history of bipolar, depression, and anxiety. The patient reports that she is not taking any medication or active in therapy to help manage her symptoms. The patient declined any therapy referrals.   The patient reports that she has only had SI in the past and that was  in 2020. The patient denies any current SI/HI/DV during the time of the assessment. The patient reports that she does not have a PCP and wanted PCP resources added to AVS. The patient reports that the baby will go to [facility] for all medical appointments. The patient reports that that she will breast feed and has a breast pump.   I reviewed information on Post partum depression, sudden infant death syndrome, car seat safety, safe sleep environment, mental health resources, post partum community resources, prenatal anxiety resources. The patient verbalized understanding.    I informed the patient about the law and hospital policy on reporting for drug exposure to children. The patient verbalized understanding. The patient reports that her last use of THC was the in June. The patient reports that she did it to help with  nausea.   The patient reports that she has a crib, bassinet, diapers, clothe, and car seat for the baby. The patient reports that the FOB and mother in law will assist during D/C.   CPS report will be made to on call DSS worker due to holiday.   CSW Plan/Description:  Sudden Infant Death Syndrome (SIDS) Education, Perinatal Mood and Anxiety Disorder (PMADs) Education, Hospital Drug Screen Policy Information, Child Protective Service Report      Corrie JINNY Ruts, LCSW 12/18/2024, 11:38 AM  "

## 2024-12-18 NOTE — Anesthesia Post-op Follow-up Note (Signed)
" °  Anesthesia Pain Follow-up Note  Patient: Christine May  Day #: 1  Date of Follow-up: 12/18/2024 Time: 10:32 AM  Last Vitals:  Vitals:   12/18/24 0304 12/18/24 0759  BP: (!) 99/53 (!) 91/51  Pulse: 69 72  Resp: 18 18  Temp: 36.9 C 36.7 C  SpO2: 100% 100%    Level of Consciousness: alert  Pain: none   Side Effects:None  Catheter Site Exam:clean, dry, no drainage     Plan: D/C from anesthesia care at surgeon's request  [facility]     "

## 2024-12-18 NOTE — Discharge Instructions (Addendum)
 Discharge Instructions:   Follow-up Appointment:  If there are any new medications, they have been ordered and will be available for pickup at the listed pharmacy on your way home from the hospital.   Call office if you have any of the following: headache, visual changes, fever >100.4 F, chills, shortness of breath, breast concerns, excessive vaginal bleeding, incision drainage or problems, leg pain or redness, depression or any other concerns. If you have vaginal discharge with an odor, let your doctor know.   It is normal to bleed for up to 6 weeks. You should not soak through more than 1 pad in 1 hour. If you have a blood clot larger than your fist with continued bleeding, call your doctor.   Activity: Do not lift > 10 lbs for 6 weeks (do not lift anything heavier than your baby). No intercourse, tampons, swimming pools, hot tubs, baths (only showers) for 6 weeks.  No driving for 1-2 weeks. Continue prenatal vitamin, especially if breastfeeding. Increase calories and fluids (water) while breastfeeding.   Your milk will come in, in the next couple of days (right now it is colostrum). You may have a slight fever when your milk comes in, but it should go away on its own.  If it does not, and rises above 100.4 F please call the doctor. You will also feel achy and your breasts will be firm. They will also start to leak. If you are breastfeeding, continue as you have been and you can pump/express milk for comfort.   If you have too much milk, your breasts can become engorged, which could lead to mastitis. This is an infection of the milk ducts. It can be very painful and you will need to notify your doctor to obtain a prescription for antibiotics. You can also treat it with a shower or hot/cold compress.   For concerns about your baby, please call your pediatrician.  For breastfeeding concerns, the lactation consultant can be reached at [phone].   Postpartum blues (feelings of happy one  minute and sad another minute) are normal for the first few weeks but if it gets worse let your doctor know.   Congratulations! We enjoyed caring for you and your new bundle of joy!  Some PCP options in [place] area- not a comprehensive list  [facility]- [phone] [facility]- [phone] [facility]- [phone] [facility]- [phone] [facility]- [phone] [facility]- [phone]  or [facility] Physician Referral Line [phone]

## 2024-12-18 NOTE — Lactation Note (Signed)
 This note was copied from a baby's chart. Lactation Consultation Note  Patient Name: Boy Asalee Barrette Unijb'd Date: 12/18/2024 Age:25 hours Reason for consult: Follow-up assessment;Primapara   Maternal Data Has patient been taught Hand Expression?: Yes Does the patient have breastfeeding experience prior to this delivery?: No  Feeding Mother's Current Feeding Choice: Breast Milk Called to room to assess latch, a visitor, dad's mom, had helped her, mom shown how to support breast and flange baby's lips at breast, she reports no pain with latch, audible swallows heard. LATCH Score Latch: Grasps breast easily, tongue down, lips flanged, rhythmical sucking.  Audible Swallowing: Spontaneous and intermittent  Type of Nipple: Everted at rest and after stimulation  Comfort (Breast/Nipple): Soft / non-tender (reports no discomfort)  Hold (Positioning): Assistance needed to correctly position infant at breast and maintain latch.  LATCH Score: 9   Lactation Tools Discussed/Used    Interventions Interventions: Breast feeding basics reviewed;Adjust position;Education  Discharge Pump: Personal WIC Program: Yes  Consult Status Consult Status: PRN    Aldona JONETTA Converse 12/18/2024, 11:42 AM

## 2024-12-18 NOTE — Anesthesia Postprocedure Evaluation (Signed)
"   Anesthesia Post Note  Patient: Christine May  Procedure(s) Performed: AN AD HOC LABOR EPIDURAL  Patient location during evaluation: Mother Baby Anesthesia Type: Epidural Level of consciousness: awake and alert Pain management: pain level controlled Vital Signs Assessment: post-procedure vital signs reviewed and stable Respiratory status: spontaneous breathing, nonlabored ventilation and respiratory function stable Cardiovascular status: stable Postop Assessment: no headache, no backache and epidural receding Anesthetic complications: no   No notable events documented.   Last Vitals:  Vitals:   12/18/24 0304 12/18/24 0759  BP: (!) 99/53 (!) 91/51  Pulse: 69 72  Resp: 18 18  Temp: 36.9 C 36.7 C  SpO2: 100% 100%    Last Pain:  Vitals:   12/18/24 0858  TempSrc:   PainSc: 3                  Debby Mines      "

## 2024-12-18 NOTE — Plan of Care (Signed)
 Patient discharged home with family.  Discharge instructions, when to follow up, and prescriptions reviewed with patient.  Patient verbalized understanding. Patient will be escorted out by RN. Discharge education done by Rolin Fuelling, RN 12/18/24 @1815 

## 2024-12-18 NOTE — Lactation Note (Addendum)
 This note was copied from a baby's chart. Lactation Consultation Note  Patient Name: [name] Unijb'd Date: 12/18/2024 Age:25 hours Reason for consult: Primapara;Term;Initial assessment   Maternal Data Has patient been taught Hand Expression?: Yes Does the patient have breastfeeding experience prior to this delivery?: No  Feeding Mother's Current Feeding Choice: Breast Milk Mom reports latch sl tender, states baby not getting deep latch, encouraged mom to call for assist at next feeding so I can assess latch, baby last ate at 0850, mom requested a DEBP kit and pump last night per her request, just to see  Questions answered about pumping.   LATCH Score                    Lactation Tools Discussed/Used    Interventions Interventions: Breast feeding basics reviewed;Education LC name and no written on white board, coconut oil Discharge Pump: Personal WIC Program: Yes  Consult Status Consult Status: PRN    Aldona JONETTA Converse 12/18/2024, 10:55 AM

## 2024-12-18 NOTE — TOC Progression Note (Signed)
 Transition of Care [facility]) - Progression Note    Patient Details  Name: Christine May MRN: [id number] Date of Birth: 2001/05/01  [facility] CM/SW Contact  Corrie JINNY Ruts, LCSW Phone Number: 12/18/2024, 1:43 PM  Clinical Narrative:    Chart reviewed. CPS report was made with on call DSS worker, Lauraine in [place]. [phone]. The DSS worker reports that the reports will most likely be screened out.                      Expected Discharge Plan and Services         Expected Discharge Date: 12/18/24                                     Social Drivers of Health (SDOH) Interventions SDOH Screenings   Food Insecurity: No Food Insecurity (12/17/2024)  Housing: Low Risk (12/17/2024)  Transportation Needs: No Transportation Needs (12/17/2024)  Utilities: Not At Risk (12/17/2024)  Alcohol Screen: Low Risk (05/09/2024)  Financial Resource Strain: Low Risk (05/09/2024)  Physical Activity: Sufficiently Active (05/09/2024)  Social Connections: Socially Isolated (05/09/2024)  Stress: Stress Concern Present (05/09/2024)  Tobacco Use: Low Risk (12/17/2024)  Health Literacy: Adequate Health Literacy (05/09/2024)    Readmission Risk Interventions     No data to display

## 2024-12-18 NOTE — Lactation Note (Signed)
 This note was copied from a baby's chart. Lactation Consultation Note  Patient Name: Christine May Unijb'd Date: 12/18/2024 Age:25 hours Reason for consult: Follow-up assessment;Primapara;Term;Maternal discharge   Maternal Data Has patient been taught Hand Expression?: Yes Does the patient have breastfeeding experience prior to this delivery?: No  Feeding Mother's Current Feeding Choice: Breast Milk Nipple Type: Slow - flow Baby awakened to feed post circ, latched well to left breast in cradle and then football hold, audible swallows heard, mom has cramping with nursing, baby sleepy after few minutes, mom had prepumped earlier with DEBP and obtained 10 cc, mom then fed baby this amt with slow flow nipple, baby took well with paced bottlefeeding method  LATCH Score Latch: Grasps breast easily, tongue down, lips flanged, rhythmical sucking.  Audible Swallowing: Spontaneous and intermittent  Type of Nipple: Everted at rest and after stimulation  Comfort (Breast/Nipple): Filling, red/small blisters or bruises, mild/mod discomfort  Hold (Positioning): No assistance needed to correctly position infant at breast.  LATCH Score: 9   Lactation Tools Discussed/Used Tools: Pump Breast pump type: Double-Electric Breast Pump Pump Education: Setup, frequency, and cleaning;Milk Storage Reason for Pumping: parents asked to Pumping frequency: prn Pumped volume: 10 mL  Interventions Interventions: Assisted with latch;Hand express;Support pillows  Discharge Pump: DEBP  Consult Status Consult Status: PRN    Aldona JONETTA Converse 12/18/2024, 3:39 PM

## 2024-12-18 NOTE — Progress Notes (Signed)
 "  Subjective:   Christine May had a NSVB on 12/17/2024. Her labor was uncomplicated. Has had routine postpartum care.  Eating, hydrating, and voiding regularly without difficulty. Since delivery has not had BM. She is breast feeding. Reports mild/moderate vaginal bleeding, denies passing large blood clots. Has had cramping abdomen pain relieved with tylenol /ibuprofen . Plans to use Unsure for contraception. Denies anxiety/depression symptoms. Endorses good support from partner and family.   Objective:  Vital signs in last 24 hours: Temp:  [98.1 F (36.7 C)-100.8 F (38.2 C)] 98.1 F (36.7 C) (01/01 0759) Pulse Rate:  [69-107] 72 (01/01 0759) Resp:  [18] 18 (01/01 0759) BP: (86-122)/(44-85) 91/51 (01/01 0759) SpO2:  [94 %-100 %] 100 % (01/01 0759)    General: NAD Pulmonary: no increased work of breathing Breasts: soft, non-tender, nipples without breakdown Abdomen: soft, non-tender Fundus: firm, midline, at umbilicus Lochia: light rubra, no clots Perineum: no erythema or foul odor discharge, minimal edema, laceration well approximated  Extremities: no edema, no erythema, no tenderness  Results for orders placed or performed during the hospital encounter of 12/17/24 (from the past 72 hours)  CBC     Status: Abnormal   Collection Time: 12/17/24  5:21 AM  Result Value Ref Range   WBC 15.4 (H) 4.0 - 10.5 K/uL   RBC 3.60 (L) 3.87 - 5.11 MIL/uL   Hemoglobin 11.3 (L) 12.0 - 15.0 g/dL   HCT 66.1 (L) 63.9 - 53.9 %   MCV 93.9 80.0 - 100.0 fL   MCH 31.4 26.0 - 34.0 pg   MCHC 33.4 30.0 - 36.0 g/dL   RDW 86.6 88.4 - 84.4 %   Platelets 257 150 - 400 K/uL   nRBC 0.0 0.0 - 0.2 %    Comment: Performed at [facility], [address] [place], [place] [address]  Type and screen     Status: None   Collection Time: 12/17/24  5:21 AM  Result Value Ref Range   ABO/RH(D) A POS    Antibody Screen NEG    Sample Expiration      12/20/2024,2359 Performed at [facility],  [address] [place], [place] [address]   RPR     Status: None   Collection Time: 12/17/24  5:21 AM  Result Value Ref Range   RPR Ser Ql NON REACTIVE NON REACTIVE    Comment: Performed at [facility], [address] [place], [place] [address]  ABO/Rh     Status: None   Collection Time: 12/17/24  6:46 AM  Result Value Ref Range   ABO/RH(D)      A POS Performed at [facility], [address] [place], [place] [address]   CBC     Status: Abnormal   Collection Time: 12/18/24  5:48 AM  Result Value Ref Range   WBC 14.5 (H) 4.0 - 10.5 K/uL   RBC 3.25 (L) 3.87 - 5.11 MIL/uL   Hemoglobin 10.3 (L) 12.0 - 15.0 g/dL   HCT 69.1 (L) 63.9 - 53.9 %   MCV 94.8 80.0 - 100.0 fL   MCH 31.7 26.0 - 34.0 pg   MCHC 33.4 30.0 - 36.0 g/dL   RDW 86.3 88.4 - 84.4 %   Platelets 229 150 - 400 K/uL   nRBC 0.0 0.0 - 0.2 %    Comment: Performed at [facility], [address] [place], [place] [address]     Assessment:   24 y.o. [id number] postpartum day 1 s/p spontaneous  vaginal delivery on 12/17/2024 Feeding: Breast Anemia secondary to acute blood loss, is not significant for admission; symptoms: none Vital signs Reviewed and stable Pain well controlled  Plan:    PO Fe Blood Type --/--/A POS Performed at [facility], [address] [place], [place] [address]  [phone]) / Dama 4.00 (06/19 1116) / Varicella Non-Immune Rhogam: not indicated Tdap: Given prenatally Varicella:ordered Rubella: not indicated Flu: Given prenatally Feeding plan breast, lactation support Encouraged to continue breastfeeding, BF education on latch, position changes, cluster feeding, hunger cues, lactogenesis II, milk supply Education given regarding options for contraception, as well as compatibility with breast feeding if applicable.  Patient unsure on method for contraception. Continued routine postpartum care  Counseled on normal uterine involution and vaginal bleeding  postpartum Anticipate discharge home this afternoon pending infant's 24h cares.    Harlene LITTIE Cisco, CNM [facility] 12/18/2024, 9:25 AM   "

## 2024-12-19 ENCOUNTER — Telehealth: Payer: Self-pay | Admitting: Obstetrics

## 2024-12-19 NOTE — Telephone Encounter (Signed)
 I contacted the patient via phone, to scheduled 2 week postpartum and 6 week postpartum with [name] Canny. I left message for the patient to contact our office for scheduling.

## 2024-12-22 ENCOUNTER — Other Ambulatory Visit: Payer: MEDICAID

## 2024-12-22 ENCOUNTER — Encounter: Payer: MEDICAID | Admitting: Certified Nurse Midwife

## 2024-12-24 ENCOUNTER — Telehealth: Payer: Self-pay

## 2024-12-24 NOTE — Telephone Encounter (Signed)
 Pt called triage left a message she had some questions about Swelling. I called her back and left message for her to return my call if she still had questions.

## 2025-01-02 ENCOUNTER — Telehealth: Payer: MEDICAID | Admitting: Obstetrics

## 2025-01-02 NOTE — Progress Notes (Signed)
"   ° °  Virtual Visit via Video Note   I connected with Christine May  on 01/02/25 2:56 PM by a video enabled telemedicine application and verified that I am speaking with the correct person using two identifiers.   Location: Patient: home Provider: [facility]   I discussed the limitations of evaluation and management by telemedicine and the availability of in person appointments. The patient expressed understanding and agreed to proceed. Subjective:    Subjective  Christine May is a 24 y.o. female who presents for a postpartum visit. She is2 weeks  postpartum following a spontaneous vaginal delivery. I have fully reviewed the prenatal and intrapartum course. The delivery was at 40w .  Anesthesia: epidural.    Postpartum course has been normal. She has no pain and no other concerns. Baby's course has been normal. Baby is feeding by  breast and bottle. Bleeding is minimal. Bowel function is normal. Bladder function is normal.  .  Contraception method: undecided. Postpartum depression screening: negative     Review of Systems Pertinent positives noted in HPI. Remainder of comprehensive ROS otherwise negative.     Objective:      Objective [] Expand by Default There were no vitals taken for this visit.  General:  alert, cooperative, and no distress        Assessment:    Normal postpartum course, mood stable.   Plan:      Plan -Anticipatory guidance about the postpartum period -Reviewed s/s of PPD and PPA -Discussed when to seek medical care -Office visit at 6 weeks postpartum  I discussed the assessment and treatment plan with the patient. The patient was provided an opportunity to ask questions and all were answered. The patient agreed with the plan and demonstrated an understanding of the instructions.   The patient was advised to call back or seek an in-person evaluation if the symptoms worsen or if the condition fails to improve as anticipated.   I provided 7  minutes of non-face-to-face time during this encounter.     Wyonia Fontanella M Aldric Wenzler, CNM   "

## 2025-01-06 ENCOUNTER — Encounter: Payer: Self-pay | Admitting: Certified Nurse Midwife

## 2025-01-14 ENCOUNTER — Telehealth: Payer: MEDICAID | Admitting: Obstetrics

## 2025-01-14 ENCOUNTER — Encounter: Payer: Self-pay | Admitting: Obstetrics

## 2025-01-14 DIAGNOSIS — F53 Postpartum depression: Secondary | ICD-10-CM

## 2025-01-14 NOTE — Progress Notes (Signed)
 Virtual Visit via Video Note  I connected with Christine May on 01/14/25 at  3:55 PM EST by a video enabled telemedicine application and verified that I am speaking with the correct person using two identifiers.  Location: Patient: home Provider: [facility] office   I discussed the limitations of evaluation and management by telemedicine and the availability of in person appointments. The patient expressed understanding and agreed to proceed.  History of Present Illness: Christine May reports that for the past week or so, she has been concerned about depression based on worsening symptoms of anger, irritability, and withdrawing. She reports some sadness and anxiety, but those are not her main concerns today. She reports that she has been easily annoyed at her partner. She usually wants to go out and do things, but at times she feels like she would rather be alone and go to sleep. She reports that while she has been passively thinking about self-harm, she does not have any suicidal intent or plan. She has a history of self-harm, but it has been 3 years since she has harmed herself and she does not plan to now. She has taken medication in the past with some improvement but not significant changes. She has a therapist but feels they are not helpful. She is getting about 4-5 interrupted hours of sleep each night; the longest stretch of sleep is 2-3 hours per night. She often feels overstimulated. She feels that she is managing fairly well right now and is still able to engage with her family and do her daily activities. It helps her to go outside and run normal errands. She is pumping breast milk.   Observations/Objective:  Edinburgh Postnatal Depression Scale - 01/14/25 1542       Edinburgh Postnatal Depression Scale:  In the Past 7 Days   I have been able to laugh and see the funny side of things. 0    I have looked forward with enjoyment to things. 0    I have blamed myself unnecessarily when things went  wrong. 2    I have been anxious or worried for no good reason. 3    I have felt scared or panicky for no good reason. 2    Things have been getting on top of me. 2    I have been so unhappy that I have had difficulty sleeping. 0    I have felt sad or miserable. 2    I have been so unhappy that I have been crying. 1    The thought of harming myself has occurred to me. 2    Edinburgh Postnatal Depression Scale Total 14           Assessment and Plan: PPD/Adjustment disorder  Follow Up Instructions: -Discussed options of medications. She does not want to start a medication right now, but will consider if her symptoms worsen or do not improve.  -She would like to speak to a different counselor. Referral place. -Link sent to PSI via MyChart. Encouraged to join a support group and connect with other new parents.  -Discussed importance of adequate rest, breaks, nutrition, and engaging in enjoyable activities. Recommend having partner take a feeding overnight so she can get 4 hours of uninterrupted sleep. -Instructed to seek immediate medical attention if she has SI/HI -Will follow up at 6-week PP visit.   I discussed the assessment and treatment plan with the patient. The patient was provided an opportunity to ask questions and all were answered. The patient agreed  with the plan and demonstrated an understanding of the instructions.   The patient was advised to call back or seek an in-person evaluation if the symptoms worsen or if the condition fails to improve as anticipated.  I provided 10 minutes of non-face-to-face time during this encounter.   Arneda Sappington M Bryne Lindon, CNM

## 2025-01-28 ENCOUNTER — Ambulatory Visit: Payer: MEDICAID | Admitting: Obstetrics
